# Patient Record
Sex: Female | Born: 1990 | Race: Black or African American | Hispanic: No | Marital: Single | Smoking: Never smoker
Health system: Southern US, Community
[De-identification: ages and names within clinical notes are randomized; demographics above are authoritative.]

## PROBLEM LIST (undated history)

## (undated) ENCOUNTER — Inpatient Hospital Stay: Payer: Self-pay

---

## 2009-10-14 ENCOUNTER — Ambulatory Visit: Payer: Self-pay | Admitting: Internal Medicine

## 2010-08-04 ENCOUNTER — Ambulatory Visit: Payer: Self-pay | Admitting: Internal Medicine

## 2011-03-15 ENCOUNTER — Observation Stay: Payer: Self-pay | Admitting: Obstetrics and Gynecology

## 2011-03-24 ENCOUNTER — Observation Stay: Payer: Self-pay | Admitting: Obstetrics and Gynecology

## 2011-03-25 ENCOUNTER — Ambulatory Visit: Payer: Self-pay | Admitting: Obstetrics and Gynecology

## 2011-03-29 ENCOUNTER — Observation Stay: Payer: Self-pay | Admitting: Obstetrics and Gynecology

## 2011-04-16 ENCOUNTER — Inpatient Hospital Stay: Payer: Self-pay

## 2013-09-15 ENCOUNTER — Ambulatory Visit: Payer: Self-pay | Admitting: Physician Assistant

## 2014-03-22 ENCOUNTER — Ambulatory Visit: Payer: Self-pay | Admitting: Physician Assistant

## 2014-03-22 LAB — URINALYSIS, COMPLETE
BACTERIA: NEGATIVE
BLOOD: NEGATIVE
Bilirubin,UR: NEGATIVE
GLUCOSE, UR: NEGATIVE
Ketone: NEGATIVE
Leukocyte Esterase: NEGATIVE
NITRITE: NEGATIVE
Ph: 6 (ref 5.0–8.0)
Protein: NEGATIVE
SPECIFIC GRAVITY: 1.025 (ref 1.000–1.030)

## 2014-03-22 LAB — GC/CHLAMYDIA PROBE AMP

## 2014-03-22 LAB — WET PREP, GENITAL

## 2014-05-07 LAB — OB RESULTS CONSOLE RUBELLA ANTIBODY, IGM: Rubella: IMMUNE

## 2014-05-07 LAB — OB RESULTS CONSOLE GC/CHLAMYDIA
Chlamydia: NEGATIVE
GC PROBE AMP, GENITAL: NEGATIVE

## 2014-05-07 LAB — OB RESULTS CONSOLE HEPATITIS B SURFACE ANTIGEN: HEP B S AG: NEGATIVE

## 2014-05-07 LAB — OB RESULTS CONSOLE VARICELLA ZOSTER ANTIBODY, IGG: VARICELLA IGG: IMMUNE

## 2014-05-07 LAB — OB RESULTS CONSOLE HIV ANTIBODY (ROUTINE TESTING): HIV: NONREACTIVE

## 2014-05-07 LAB — OB RESULTS CONSOLE ANTIBODY SCREEN: Antibody Screen: NEGATIVE

## 2014-05-07 LAB — OB RESULTS CONSOLE RPR: RPR: NONREACTIVE

## 2014-05-08 LAB — OB RESULTS CONSOLE ABO/RH: RH TYPE: POSITIVE

## 2014-05-14 ENCOUNTER — Encounter: Payer: Self-pay | Admitting: Family Medicine

## 2014-05-19 ENCOUNTER — Encounter: Payer: Self-pay | Admitting: Family Medicine

## 2014-06-23 ENCOUNTER — Ambulatory Visit: Payer: Self-pay | Admitting: Internal Medicine

## 2014-06-23 LAB — URINALYSIS, COMPLETE
BILIRUBIN, UR: NEGATIVE
Blood: NEGATIVE
Glucose,UR: NEGATIVE
Ketone: NEGATIVE
LEUKOCYTE ESTERASE: NEGATIVE
NITRITE: NEGATIVE
PH: 7.5 (ref 5.0–8.0)
PROTEIN: NEGATIVE
Specific Gravity: 1.02 (ref 1.000–1.030)

## 2014-06-26 ENCOUNTER — Emergency Department: Payer: Self-pay | Admitting: Emergency Medicine

## 2014-07-19 NOTE — L&D Delivery Note (Signed)
CTSP as pt is C/C/+2 station and pushing without control. Arrived with the RN delivering the head, CAN x 1 reduced per RN and body and to mom's abd at  1416  as I gowned and gloved and received the baby going to the abd. Baby cried x 1 then seemed stunned. CCx2 and cut and to warmer. Dr Cleatis PolkaAuten called but, after 2 mins the baby resumed crying. SDOP intact, FF and lochia mod. EBL 50 ml's. VSS.Hemostasis achieved. No sutures, needles or sponges used. Baby with 7-8 Apgars , 2820 6#3. FHR down in the 60's and RN had pt push to deliver to avoid fetal stress.

## 2014-09-25 LAB — OB RESULTS CONSOLE HIV ANTIBODY (ROUTINE TESTING): HIV: NONREACTIVE

## 2014-10-04 ENCOUNTER — Observation Stay: Payer: Self-pay | Admitting: Obstetrics and Gynecology

## 2014-10-13 ENCOUNTER — Ambulatory Visit: Payer: Self-pay | Admitting: Physician Assistant

## 2014-10-13 LAB — RAPID STREP-A WITH REFLX: MICRO TEXT REPORT: NEGATIVE

## 2014-10-16 LAB — BETA STREP CULTURE(ARMC)

## 2014-10-24 ENCOUNTER — Observation Stay
Admit: 2014-10-24 | Disposition: A | Discharge status: Home / Self Care | Payer: Self-pay | Attending: Obstetrics and Gynecology | Admitting: Obstetrics and Gynecology

## 2014-10-24 LAB — URINALYSIS, COMPLETE
BACTERIA: NONE SEEN
BILIRUBIN, UR: NEGATIVE
BLOOD: NEGATIVE
GLUCOSE, UR: NEGATIVE mg/dL (ref 0–75)
Ketone: NEGATIVE
Leukocyte Esterase: NEGATIVE
NITRITE: NEGATIVE
PH: 6 (ref 4.5–8.0)
Protein: NEGATIVE
RBC,UR: 1 /HPF (ref 0–5)
Specific Gravity: 1.023 (ref 1.003–1.030)
Squamous Epithelial: 1
WBC UR: 4 /HPF (ref 0–5)

## 2014-11-02 ENCOUNTER — Observation Stay
Admit: 2014-11-02 | Disposition: A | Discharge status: Home / Self Care | Payer: Self-pay | Attending: Obstetrics and Gynecology | Admitting: Obstetrics and Gynecology

## 2014-11-02 LAB — URINALYSIS, COMPLETE
Bilirubin,UR: NEGATIVE
Blood: NEGATIVE
Glucose,UR: 150 mg/dL (ref 0–75)
LEUKOCYTE ESTERASE: NEGATIVE
NITRITE: NEGATIVE
PROTEIN: NEGATIVE
Ph: 6 (ref 4.5–8.0)
Specific Gravity: 1.03 (ref 1.003–1.030)

## 2014-11-18 ENCOUNTER — Observation Stay
Admission: EM | Admit: 2014-11-18 | Discharge: 2014-11-18 | Disposition: A | Discharge status: Home / Self Care | Payer: Medicaid Other | Attending: Obstetrics and Gynecology | Admitting: Obstetrics and Gynecology

## 2014-11-18 DIAGNOSIS — Z3A39 39 weeks gestation of pregnancy: Secondary | ICD-10-CM | POA: Insufficient documentation

## 2014-11-18 MED ORDER — CYCLOBENZAPRINE HCL 10 MG PO TABS
10.0000 mg | ORAL_TABLET | Freq: Once | ORAL | Status: AC
  Administered 2014-11-18: 10 mg via ORAL
  Filled 2014-11-18 (×2): qty 1

## 2014-11-18 MED ORDER — OXYCODONE-ACETAMINOPHEN 5-325 MG PO TABS: 2.0000 | ORAL_TABLET | Freq: Once | ORAL | Status: DC

## 2014-11-18 MED ORDER — OXYCODONE-ACETAMINOPHEN 5-325 MG PO TABS
ORAL_TABLET | ORAL | Status: AC
  Filled 2014-11-18: qty 2

## 2014-11-18 NOTE — OB Triage Note (Signed)
G2P1 pt. at 6636 weeks' gestation [facility] reporting ongoing but worsening back pain she rates at 9/10 and contractions she has experienced throughout pregnancy.

## 2014-11-23 LAB — OB RESULTS CONSOLE GC/CHLAMYDIA
CHLAMYDIA, DNA PROBE: NEGATIVE
GC PROBE AMP, GENITAL: NEGATIVE

## 2014-11-23 LAB — OB RESULTS CONSOLE GBS: STREP GROUP B AG: NEGATIVE

## 2014-11-26 NOTE — H&P (Signed)
L&D Evaluation:  History:  HPI 24 yo G2P1001 at 32+3 by LMP c/w early US. She presents with 2 days of lower pelvic pressure without definitive pain or contractions. She [facility] less than 24 hours ago and was checked. Her cervix was reportedly short, but closed then. She denies vaginal bleeding, leaking fluid, discharge.   Presents with pelvic pressure   Patient's Medical History No Chronic Illness   Patient's Surgical History none   Medications Pre Natal Vitamins   Allergies NKDA   Social History none   ROS:  ROS All systems were reviewed.  HEENT, CNS, GI, GU, Respiratory, CV, Renal and Musculoskeletal systems were found to be normal.   Exam:  Vital Signs stable   Urine Protein not completed   General no apparent distress   Chest clear   Abdomen gravid, non-tender   Estimated Fetal Weight Average for gestational age   Fetal Position vtx by Leopolds   Pelvic FT/50/-4 posterior, cervix firm. Tight 0.5 cm internal os that may be tranversed with finger if this was attempted   Mebranes Intact   FHT normal rate with no decels   Fetal Heart Rate 140   Ucx absent   Plan:  Comments 1. R/O PTL: Pt is not a candidate for FFN due to recent SVE. No SVE change after 4 hours, no uterine activity noted on tocometer.  2. FWB: Reassuring tracing. 3. Plan to d/c home with precautions given and follow in 2 weeks already scheduled.   Follow Up Appointment already scheduled   Electronic Signatures: Areta HaberGoodman, David M (MD)  (Signed 07-Apr-16 07:09)  Authored: L&D Evaluation   Last Updated: 07-Apr-16 07:09 by Areta HaberGoodman, David M (MD)

## 2014-11-27 ENCOUNTER — Observation Stay
Admission: AD | Admit: 2014-11-27 | Discharge: 2014-11-27 | Disposition: A | Discharge status: Home / Self Care | Payer: Medicaid Other | Source: Ambulatory Visit | Attending: Obstetrics and Gynecology | Admitting: Obstetrics and Gynecology

## 2014-11-27 DIAGNOSIS — N898 Other specified noninflammatory disorders of vagina: Secondary | ICD-10-CM | POA: Diagnosis not present

## 2014-11-27 DIAGNOSIS — Z3A37 37 weeks gestation of pregnancy: Secondary | ICD-10-CM | POA: Insufficient documentation

## 2014-11-27 DIAGNOSIS — M545 Low back pain: Secondary | ICD-10-CM | POA: Diagnosis not present

## 2014-11-27 DIAGNOSIS — O26893 Other specified pregnancy related conditions, third trimester: Principal | ICD-10-CM | POA: Insufficient documentation

## 2014-11-27 DIAGNOSIS — Z3490 Encounter for supervision of normal pregnancy, unspecified, unspecified trimester: Secondary | ICD-10-CM

## 2014-11-27 DIAGNOSIS — Z0371 Encounter for suspected problem with amniotic cavity and membrane ruled out: Secondary | ICD-10-CM | POA: Diagnosis present

## 2014-11-27 MED ORDER — LACTATED RINGERS IV SOLN: 500.0000 mL | INTRAVENOUS | Status: DC | PRN

## 2014-11-27 NOTE — OB Triage Provider Note (Signed)
Triage Visit for Rule out Rupture of Membranes   Subjective:   Tracey Hood is a 24 y.o. female.G2p1001. She is at 4820w2d gestation. She has noted increased vaginal discharge for the last 2 days.  She denies bleeding, contractions, cramping. No vaginal pain or pruritius. No dysparunia. Good fetal movement, but less than previously in pregnancy.  Toco noted prolonged deceleration after 5 minute contraction that the patient didn't feel. Spontaneously returned to baseline with mod var, +accels.  Her pregnancy has been complicated by: lower back pain  PMH, PSH, POBH and problem list reviewed Medications and allergies reviewed.    Objective:   Filed Vitals:   11/27/14 1240 11/27/14 1255 11/27/14 1310 11/27/14 1325  BP: 117/61 120/64 115/68 122/65  Pulse: 79 81 84 87    General appearance: alert, well appearing, and in no distress. Abdomen: soft, non tender. Sterile speculum exam: white physiologic discharge.   External fetal monitoring: 135, mod var, +accels, no decels except as noted above Ultrasound:  Cephalic presentation AFI 13.3cm Anterior placenta, spine to maternal right  Wet mount: Neg for pseudohyphae, trichomonas, clue cells. + many lactobacilli. Neg for ferning.    Assessment:   Pregnancy at 9520w2d with concerns for PROM. Spontaneous deceleration on monitoring.  Evaluation reveals: Intact membranes, prolonged fetal monitoring reassuring. AFI adequate, which is reassuring for placenta profusion and function.   Plan:   Prolonged monitoring. D/C home with fetal kick counts reviewed. 10 movements/1hr.  Orders placed:  Orders Placed This Encounter  Procedures  . Place in observation (patient's expected length of stay will be less than 2 midnights)    Standing Status: Standing     Number of Occurrences: 1     Standing Expiration Date:     Order Specific Question:  Diagnosis    Answer:  Pregnancy [203531]    Order Specific Question:  Level of Care   Answer:  BIRTHING SUITE [17]    Order Specific Question:  Admitting Physician    Answer:  Darrell JewelBEASLEY, Chinedu Agustin [[id number]]    Order Specific Question:  Attending Physician    Answer:  Darrell JewelBEASLEY, Shirlene Andaya [[id number]]    Order Specific Question:  PT Class (Do Not Modify)    Answer:  Observation [104]    Order Specific Question:  PT Acc Code (Do Not Modify)    Answer:  Observation [10022]    Patient expresses understanding of information provided and plan of care.

## 2014-11-27 NOTE — Discharge Instructions (Signed)
Fetal Movement Counts  Patient Name: __________________________________________________ Patient Due Date: ____________________ Performing a fetal movement count is highly recommended in high-risk pregnancies, but it is good for every pregnant woman to do. Your health care provider may ask you to start counting fetal movements at 28 weeks of the pregnancy. Fetal movements often increase:  After eating a full meal.  After physical activity.  After eating or drinking something sweet or cold.  At rest. Pay attention to when you feel the baby is most active. This will help you notice a pattern of your baby's sleep and wake cycles and what factors contribute to an increase in fetal movement. It is important to perform a fetal movement count at the same time each day when your baby is normally most active.  HOW TO COUNT FETAL MOVEMENTS 1. Find a quiet and comfortable area to sit or lie down on your left side. Lying on your left side provides the best blood and oxygen circulation to your baby. 2. Write down the day and time on a sheet of paper or in a journal. 3. Start counting kicks, flutters, swishes, rolls, or jabs in a 2-hour period. You should feel at least 10 movements within 2 hours. 4. If you do not feel 10 movements in 2 hours, wait 2-3 hours and count again. Look for a change in the pattern or not enough counts in 2 hours. SEEK MEDICAL CARE IF:  You feel less than 10 counts in 2 hours, tried twice.  There is no movement in over an hour.  The pattern is changing or taking longer each day to reach 10 counts in 2 hours.  You feel the baby is not moving as he or she usually does.  For example:  Date: ____________ Movements: ____________ Start time: ____________ Doreatha MartinFinish time: ____________  Date: ____________ Movements: ____________ Start time: ____________ Doreatha MartinFinish time: ____________

## 2014-12-10 ENCOUNTER — Observation Stay
Admission: EM | Admit: 2014-12-10 | Discharge: 2014-12-10 | Disposition: A | Discharge status: Home / Self Care | Payer: Medicaid Other | Attending: Obstetrics and Gynecology | Admitting: Obstetrics and Gynecology

## 2014-12-10 ENCOUNTER — Encounter: Payer: Self-pay | Admitting: *Deleted

## 2014-12-10 DIAGNOSIS — R109 Unspecified abdominal pain: Secondary | ICD-10-CM | POA: Diagnosis present

## 2014-12-10 DIAGNOSIS — O26899 Other specified pregnancy related conditions, unspecified trimester: Secondary | ICD-10-CM | POA: Diagnosis not present

## 2014-12-10 MED ORDER — ACETAMINOPHEN 325 MG PO TABS: 650.0000 mg | ORAL_TABLET | ORAL | Status: DC | PRN

## 2014-12-10 NOTE — OB Triage Note (Signed)
Patient states she started contracting around 830 this morning and they are every 5 to 6 minutes now with constant back pain.

## 2014-12-10 NOTE — Discharge Summary (Signed)
Patient discharged home, discharge instructions given, pt states understanding. Pt left floor in stable condition.denies any other needs at this time

## 2014-12-10 NOTE — Discharge Instructions (Signed)
Drink plenty of fluid, get plenty of rest. Call your provider for any other concerns.

## 2014-12-11 ENCOUNTER — Observation Stay
Admission: EM | Admit: 2014-12-11 | Discharge: 2014-12-11 | Disposition: A | Discharge status: Home / Self Care | Payer: Medicaid Other | Source: Home / Self Care | Admitting: Obstetrics and Gynecology

## 2014-12-11 DIAGNOSIS — O99891 Other specified diseases and conditions complicating pregnancy: Secondary | ICD-10-CM | POA: Diagnosis present

## 2014-12-11 DIAGNOSIS — R1030 Lower abdominal pain, unspecified: Secondary | ICD-10-CM

## 2014-12-11 DIAGNOSIS — O9989 Other specified diseases and conditions complicating pregnancy, childbirth and the puerperium: Secondary | ICD-10-CM

## 2014-12-11 DIAGNOSIS — M549 Dorsalgia, unspecified: Secondary | ICD-10-CM

## 2014-12-11 MED ORDER — ZOLPIDEM TARTRATE 5 MG PO TABS: 5.0000 mg | ORAL_TABLET | Freq: Every evening | ORAL | Status: DC | PRN

## 2014-12-11 MED ORDER — ZOLPIDEM TARTRATE 5 MG PO TABS
ORAL_TABLET | ORAL | Status: AC
  Administered 2014-12-11: 22:00:00
  Filled 2014-12-11: qty 1

## 2014-12-11 NOTE — OB Triage Note (Signed)
Patient complains of contractions and back pain, starting yesterday.  Contractions increasing in intensity about two hours ago.  Patient also complains of diarrhea today.

## 2014-12-11 NOTE — Discharge Instructions (Signed)
Braxton Hicks Contractions °Contractions of the uterus can occur throughout pregnancy. Contractions are not always a sign that you are in labor.  °WHAT ARE BRAXTON HICKS CONTRACTIONS?  °Contractions that occur before labor are called Braxton Hicks contractions, or false labor. Toward the end of pregnancy (32-34 weeks), these contractions can develop more often and may become more forceful. This is not true labor because these contractions do not result in opening (dilatation) and thinning of the cervix. They are sometimes difficult to tell apart from true labor because these contractions can be forceful and people have different pain tolerances. You should not feel embarrassed if you go to the hospital with false labor. Sometimes, the only way to tell if you are in true labor is for your health care provider to look for changes in the cervix. °If there are no prenatal problems or other health problems associated with the pregnancy, it is completely safe to be sent home with false labor and await the onset of true labor. °HOW CAN YOU TELL THE DIFFERENCE BETWEEN TRUE AND FALSE LABOR? °False Labor °· The contractions of false labor are usually shorter and not as hard as those of true labor.   °· The contractions are usually irregular.   °· The contractions are often felt in the front of the lower abdomen and in the groin.   °· The contractions may go away when you walk around or change positions while lying down.   °· The contractions get weaker and are shorter lasting as time goes on.   °· The contractions do not usually become progressively stronger, regular, and closer together as with true labor.   °True Labor °· Contractions in true labor last 30-70 seconds, become very regular, usually become more intense, and increase in frequency.   °· The contractions do not go away with walking.   °· The discomfort is usually felt in the top of the uterus and spreads to the lower abdomen and low back.   °· True labor can be  determined by your health care provider with an exam. This will show that the cervix is dilating and getting thinner.   °WHAT TO REMEMBER °· Keep up with your usual exercises and follow other instructions given by your health care provider.   °· Take medicines as directed by your health care provider.   °· Keep your regular prenatal appointments.   °· Eat and drink lightly if you think you are going into labor.   °· If Braxton Hicks contractions are making you uncomfortable:   °¨ Change your position from lying down or resting to walking, or from walking to resting.   °¨ Sit and rest in a tub of warm water.   °¨ Drink 2-3 glasses of water. Dehydration may cause these contractions.   °¨ Do slow and deep breathing several times an hour.   °WHEN SHOULD I SEEK IMMEDIATE MEDICAL CARE? °Seek immediate medical care if: °· Your contractions become stronger, more regular, and closer together.   °· You have fluid leaking or gushing from your vagina.   °· You have a fever.   °· You pass blood-tinged mucus.   °· You have vaginal bleeding.   °· You have continuous abdominal pain.   °· You have low back pain that you never had before.   °· You feel your baby's head pushing down and causing pelvic pressure.   °· Your baby is not moving as much as it used to.   °Document Released: 07/05/2005 Document Revised: 07/10/2013 Document Reviewed: 04/16/2013 °[employer]® Patient Information ©2015 [employer]. This information is not intended to replace advice given to you by your health care   provider. Make sure you discuss any questions you have with your health care provider. ° °

## 2014-12-12 ENCOUNTER — Inpatient Hospital Stay: Payer: Medicaid Other | Admitting: Registered Nurse

## 2014-12-12 ENCOUNTER — Inpatient Hospital Stay
Admission: EM | Admit: 2014-12-12 | Discharge: 2014-12-14 | DRG: 775 | Disposition: A | Discharge status: Home / Self Care | Payer: Medicaid Other | Attending: Obstetrics and Gynecology | Admitting: Obstetrics and Gynecology

## 2014-12-12 ENCOUNTER — Encounter: Payer: Self-pay | Admitting: *Deleted

## 2014-12-12 DIAGNOSIS — M542 Cervicalgia: Secondary | ICD-10-CM | POA: Diagnosis present

## 2014-12-12 DIAGNOSIS — R1030 Lower abdominal pain, unspecified: Secondary | ICD-10-CM

## 2014-12-12 DIAGNOSIS — Z3A39 39 weeks gestation of pregnancy: Secondary | ICD-10-CM | POA: Diagnosis present

## 2014-12-12 DIAGNOSIS — M545 Low back pain: Secondary | ICD-10-CM | POA: Diagnosis present

## 2014-12-12 LAB — TYPE AND SCREEN
ABO/RH(D): B POS
Antibody Screen: NEGATIVE

## 2014-12-12 LAB — CBC
HEMATOCRIT: 41.5 % (ref 35.0–47.0)
Hemoglobin: 14.1 g/dL (ref 12.0–16.0)
MCH: 32.6 pg (ref 26.0–34.0)
MCHC: 33.8 g/dL (ref 32.0–36.0)
MCV: 96.4 fL (ref 80.0–100.0)
Platelets: 149 10*3/uL — ABNORMAL LOW (ref 150–440)
RBC: 4.31 MIL/uL (ref 3.80–5.20)
RDW: 14.7 % — ABNORMAL HIGH (ref 11.5–14.5)
WBC: 10.5 10*3/uL (ref 3.6–11.0)

## 2014-12-12 LAB — ABO/RH: ABO/RH(D): B POS

## 2014-12-12 MED ORDER — BUPIVACAINE HCL (PF) 0.25 % IJ SOLN
INTRAMUSCULAR | Status: DC | PRN
  Administered 2014-12-12: 5 mL

## 2014-12-12 MED ORDER — LACTATED RINGERS IV SOLN
INTRAVENOUS | Status: DC
  Administered 2014-12-12: 13:00:00 via INTRAVENOUS

## 2014-12-12 MED ORDER — FENTANYL 2.5 MCG/ML W/ROPIVACAINE 0.2% IN NS 100 ML EPIDURAL INFUSION (ARMC-ANES)
9.0000 mL/h | EPIDURAL | Status: DC
  Administered 2014-12-12: 9 mL/h via EPIDURAL

## 2014-12-12 MED ORDER — OXYTOCIN 10 UNIT/ML IJ SOLN
INTRAMUSCULAR | Status: AC
Start: 2014-12-12 — End: 2014-12-12
  Filled 2014-12-12: qty 2

## 2014-12-12 MED ORDER — PHENYLEPHRINE 40 MCG/ML (10ML) SYRINGE FOR IV PUSH (FOR BLOOD PRESSURE SUPPORT)
80.0000 ug | PREFILLED_SYRINGE | INTRAVENOUS | Status: DC | PRN
  Filled 2014-12-12: qty 2

## 2014-12-12 MED ORDER — TERBUTALINE SULFATE 1 MG/ML IJ SOLN
0.2500 mg | Freq: Once | INTRAMUSCULAR | Status: AC | PRN
  Filled 2014-12-12: qty 1

## 2014-12-12 MED ORDER — LACTATED RINGERS IV SOLN: 500.0000 mL | INTRAVENOUS | Status: DC | PRN

## 2014-12-12 MED ORDER — DIPHENHYDRAMINE HCL 50 MG/ML IJ SOLN: 12.5000 mg | INTRAMUSCULAR | Status: DC | PRN

## 2014-12-12 MED ORDER — PENICILLIN G POTASSIUM 5000000 UNITS IJ SOLR
2.5000 10*6.[IU] | INTRAVENOUS | Status: DC
  Filled 2014-12-12 (×7): qty 2.5

## 2014-12-12 MED ORDER — CITRIC ACID-SODIUM CITRATE 334-500 MG/5ML PO SOLN: 30.0000 mL | ORAL | Status: DC | PRN

## 2014-12-12 MED ORDER — OXYCODONE-ACETAMINOPHEN 5-325 MG PO TABS
1.0000 | ORAL_TABLET | ORAL | Status: DC | PRN
Start: 2014-12-12 — End: 2014-12-13

## 2014-12-12 MED ORDER — OXYTOCIN 40 UNITS IN LACTATED RINGERS INFUSION - SIMPLE MED
62.5000 mL/h | INTRAVENOUS | Status: DC
  Administered 2014-12-12: 62.5 mL/h via INTRAVENOUS

## 2014-12-12 MED ORDER — LIDOCAINE HCL (PF) 1 % IJ SOLN
30.0000 mL | INTRAMUSCULAR | Status: DC | PRN
  Filled 2014-12-12: qty 30

## 2014-12-12 MED ORDER — MISOPROSTOL 200 MCG PO TABS
ORAL_TABLET | ORAL | Status: AC
  Filled 2014-12-12: qty 4

## 2014-12-12 MED ORDER — OXYTOCIN 40 UNITS IN LACTATED RINGERS INFUSION - SIMPLE MED: 1.0000 m[IU]/min | INTRAVENOUS | Status: DC

## 2014-12-12 MED ORDER — EPHEDRINE 5 MG/ML INJ
10.0000 mg | INTRAVENOUS | Status: DC | PRN
  Filled 2014-12-12: qty 2

## 2014-12-12 MED ORDER — PENICILLIN G POTASSIUM 5000000 UNITS IJ SOLR
5.0000 10*6.[IU] | Freq: Once | INTRAVENOUS | Status: DC
  Filled 2014-12-12: qty 5

## 2014-12-12 MED ORDER — ACETAMINOPHEN 325 MG PO TABS
650.0000 mg | ORAL_TABLET | ORAL | Status: DC | PRN
  Administered 2014-12-13 (×2): 650 mg via ORAL
  Filled 2014-12-12 (×2): qty 2

## 2014-12-12 MED ORDER — OXYTOCIN BOLUS FROM INFUSION
500.0000 mL | INTRAVENOUS | Status: DC
  Administered 2014-12-12: 500 mL via INTRAVENOUS

## 2014-12-12 MED ORDER — BUTORPHANOL TARTRATE 1 MG/ML IJ SOLN
INTRAMUSCULAR | Status: AC
  Administered 2014-12-12: 2 mg via INTRAVENOUS
  Filled 2014-12-12: qty 2

## 2014-12-12 MED ORDER — FENTANYL 2.5 MCG/ML W/ROPIVACAINE 0.2% IN NS 100 ML EPIDURAL INFUSION (ARMC-ANES)
EPIDURAL | Status: AC
  Administered 2014-12-12 (×2): 9 mL/h via EPIDURAL
  Filled 2014-12-12: qty 100

## 2014-12-12 MED ORDER — OXYTOCIN 40 UNITS IN LACTATED RINGERS INFUSION - SIMPLE MED
INTRAVENOUS | Status: AC
  Filled 2014-12-12: qty 1000

## 2014-12-12 MED ORDER — LIDOCAINE-EPINEPHRINE (PF) 1.5 %-1:200000 IJ SOLN
INTRAMUSCULAR | Status: DC | PRN
  Administered 2014-12-12: 3.5 mL

## 2014-12-12 MED ORDER — OXYCODONE-ACETAMINOPHEN 5-325 MG PO TABS
2.0000 | ORAL_TABLET | ORAL | Status: DC | PRN
Start: 2014-12-12 — End: 2014-12-13

## 2014-12-12 MED ORDER — BUTORPHANOL TARTRATE 1 MG/ML IJ SOLN: 1.0000 mg | INTRAMUSCULAR | Status: DC | PRN

## 2014-12-12 MED ORDER — LIDOCAINE HCL (PF) 1 % IJ SOLN
INTRAMUSCULAR | Status: AC
  Administered 2014-12-12: 3 mL
  Administered 2014-12-12: 3 mL via SUBCUTANEOUS
  Filled 2014-12-12: qty 30

## 2014-12-12 MED ORDER — ONDANSETRON HCL 4 MG/2ML IJ SOLN: 4.0000 mg | Freq: Four times a day (QID) | INTRAMUSCULAR | Status: DC | PRN

## 2014-12-12 MED ORDER — BUTORPHANOL TARTRATE 1 MG/ML IJ SOLN
1.0000 mg | INTRAMUSCULAR | Status: AC
  Administered 2014-12-12: 2 mg via INTRAVENOUS

## 2014-12-12 MED ORDER — AMMONIA AROMATIC IN INHA
RESPIRATORY_TRACT | Status: AC
  Filled 2014-12-12: qty 10

## 2014-12-12 NOTE — Progress Notes (Signed)
Transferred via wheelchair to 342 with infant in crib.  Belongings with patient

## 2014-12-12 NOTE — Progress Notes (Signed)
Tracey Hood is a 24 y.o. G2P1001 at 973w3d by admitted for active labor. Epidural in place.   Subjective: No complaints  Objective: BP 125/67 mmHg  Pulse 74  Temp(Src) 98.4 F (36.9 C) (Oral)  Resp 18  Ht 5\' 3"  (1.6 m)  Wt 73.483 kg (162 lb)  BMI 28.70 kg/m2  LMP 03/11/2014 (Exact Date)      FHT:  FHR: 150, +accels, no decels, Cat I bpm, variability: moderate,  accelerations:  Present,  decelerations:  Absent UC:   regular, every 9 minutes SVE:   Dilation: 6 Effacement (%): 90 Station: 0 Exam by:: HFC  Labs: Lab Results  Component Value Date   WBC 10.5 12/12/2014   HGB 14.1 12/12/2014   HCT 41.5 12/12/2014   MCV 96.4 12/12/2014   PLT 149* 12/12/2014    Assessment / Plan: Spontaneous labor, progressing normally  Labor: Progressing normally and UC's are q 9 mins.  Preeclampsia:  none Fetal Wellbeing:  Category I Pain Control:  Epidural I/D:  n/a Anticipated MOD:  NSVD  Will do iUPC and Pitocin  Sharee PimpleJONES, [place] Hallam W 12/12/2014, 12:49 PM

## 2014-12-12 NOTE — H&P (Signed)
Melida Quitterecohvia Rena Williams is a 24 y.o. female presenting for labor with cervical change of 6-8/80/vtx. PNC at [facility] significant for GBS neg. Low back pain with PT 2 x a week x 6 weeks. LMP of 03/11/14 & EDD of 12/16/14. C/W US at 3618 with EDD of 12/11/14 History OB History    Gravida Para Term Preterm AB TAB SAB Ectopic Multiple Living   2 1 1  0 0 0 0 0 0 1     PMH: Low back pain  No past surgical history on file. Family History: family history is negative for Alcohol abuse, Arthritis, Birth defects, Cancer, Drug abuse, Asthma, Diabetes, COPD, Depression, Early death, Hearing loss, Heart disease, Hyperlipidemia, Hypertension, Kidney disease, Learning disabilities, Mental illness, Mental retardation, Miscarriages / Stillbirths, Stroke, Vision loss, and Varicose Veins. Social History:  reports that she has never smoked. She has never used smokeless tobacco. She reports that she does not drink alcohol or use illicit drugs.   Prenatal Transfer Tool  Maternal Diabetes: No Genetic Screening: Declined Maternal Ultrasounds/Referrals: Normal Fetal Ultrasounds or other Referrals:  None Maternal Substance Abuse:  No Significant Maternal Medications:  None Significant Maternal Lab Results:  Lab values include: Other:  Other Comments:  Tdap declined, took Flu shot, RI, B pos, antibody neg, RPR NR, GC/CH neg, HIV NR, HIBV NR, GBS neg  ROS  Dilation: 6 Effacement (%): 90 Station: -1 Exam by:: k.yates, rn Last menstrual period 03/11/2014. Exam Physical Exam  Prenatal labs: ABO, Rh: B/Positive/-- (10/21 0000) Antibody: Negative (10/20 0000) Rubella: Immune (10/20 0000) RPR: Nonreactive (10/20 0000)  HBsAg: Negative (10/20 0000)  HIV: Non-reactive (03/09 0000)  GBS: Negative (05/07 0000)   Assessment/Plan: A: IUP at 39 3/7 weeks 2. GBS neg P: 1. Epidural for pain 2 Stadol 1-2 mg IV till Epidural in. 3. Admit for NSVD.   Milon ScoreJONES, CARON W 12/12/2014, 11:01 AM

## 2014-12-12 NOTE — Progress Notes (Signed)
Patient ID: Tracey Hood, female   DOB: 02/03/1991, 24 y.o.   MRN: [id number]  1300 AROM for very scant clear drops noted. IFSE and IUPC inserted. Pitocin augmentation added to orders. Early decel noted.

## 2014-12-12 NOTE — Anesthesia Procedure Notes (Signed)
Epidural Patient location during procedure: OB Start time: 12/12/2014 12:10 PM  Staffing Anesthesiologist: Yevette EdwardsADAMS, JAMES G Resident/CRNA: Junious SilkNOLES, Marlowe Cinquemani Performed by: anesthesiologist and resident/CRNA   Preanesthetic Checklist Completed: patient identified, site marked, surgical consent, pre-op evaluation, timeout performed, IV checked and monitors and equipment checked  Epidural Patient position: sitting Prep: Betadine Patient monitoring: heart rate, cardiac monitor, continuous pulse ox and blood pressure Approach: midline Location: L3-L4 Injection technique: LOR air  Needle:  Needle type: Tuohy  Needle gauge: 18 G Needle length: 9 cm Needle insertion depth: 5 cm Catheter type: closed end Catheter size: 20 Guage Test dose: negative and 1.5% lidocaine with Epi 1:200 K  Assessment Sensory level: T10  Additional Notes First attempt wet tap by Harrel LemonM Joelle Roswell CRNA.  Called J. Adams MDA for second attempt successful.Reason for block:procedure for pain

## 2014-12-13 LAB — RPR: RPR Ser Ql: NONREACTIVE

## 2014-12-13 LAB — CBC
HCT: 40.1 % (ref 35.0–47.0)
Hemoglobin: 13.5 g/dL (ref 12.0–16.0)
MCH: 32.2 pg (ref 26.0–34.0)
MCHC: 33.6 g/dL (ref 32.0–36.0)
MCV: 95.8 fL (ref 80.0–100.0)
Platelets: 149 10*3/uL — ABNORMAL LOW (ref 150–440)
RBC: 4.19 MIL/uL (ref 3.80–5.20)
RDW: 14.6 % — ABNORMAL HIGH (ref 11.5–14.5)
WBC: 15.9 10*3/uL — ABNORMAL HIGH (ref 3.6–11.0)

## 2014-12-13 MED ORDER — FLEET ENEMA 7-19 GM/118ML RE ENEM: 1.0000 | ENEMA | Freq: Every day | RECTAL | Status: DC | PRN

## 2014-12-13 MED ORDER — SODIUM CHLORIDE 0.9 % IV SOLN: 250.0000 mL | INTRAVENOUS | Status: DC | PRN

## 2014-12-13 MED ORDER — ZOLPIDEM TARTRATE 5 MG PO TABS: 5.0000 mg | ORAL_TABLET | Freq: Every evening | ORAL | Status: DC | PRN

## 2014-12-13 MED ORDER — PRENATAL MULTIVITAMIN CH: 1.0000 | ORAL_TABLET | Freq: Every day | ORAL | Status: DC

## 2014-12-13 MED ORDER — ACETAMINOPHEN 325 MG PO TABS: 650.0000 mg | ORAL_TABLET | ORAL | Status: DC | PRN

## 2014-12-13 MED ORDER — SIMETHICONE 80 MG PO CHEW: 80.0000 mg | CHEWABLE_TABLET | ORAL | Status: DC | PRN

## 2014-12-13 MED ORDER — WITCH HAZEL-GLYCERIN EX PADS: 1.0000 "application " | MEDICATED_PAD | CUTANEOUS | Status: DC | PRN

## 2014-12-13 MED ORDER — IBUPROFEN 600 MG PO TABS
600.0000 mg | ORAL_TABLET | Freq: Four times a day (QID) | ORAL | Status: DC
  Administered 2014-12-13 – 2014-12-14 (×2): 600 mg via ORAL
  Filled 2014-12-13: qty 1

## 2014-12-13 MED ORDER — DIBUCAINE 1 % RE OINT: 1.0000 "application " | TOPICAL_OINTMENT | RECTAL | Status: DC | PRN

## 2014-12-13 MED ORDER — OXYCODONE-ACETAMINOPHEN 5-325 MG PO TABS: 2.0000 | ORAL_TABLET | ORAL | Status: DC | PRN

## 2014-12-13 MED ORDER — ONDANSETRON HCL 4 MG PO TABS: 4.0000 mg | ORAL_TABLET | ORAL | Status: DC | PRN

## 2014-12-13 MED ORDER — DIPHENHYDRAMINE HCL 25 MG PO CAPS: 25.0000 mg | ORAL_CAPSULE | Freq: Four times a day (QID) | ORAL | Status: DC | PRN

## 2014-12-13 MED ORDER — TETANUS-DIPHTH-ACELL PERTUSSIS 5-2.5-18.5 LF-MCG/0.5 IM SUSP
0.5000 mL | Freq: Once | INTRAMUSCULAR | Status: DC
  Filled 2014-12-13: qty 0.5

## 2014-12-13 MED ORDER — OXYTOCIN 40 UNITS IN LACTATED RINGERS INFUSION - SIMPLE MED: 62.5000 mL/h | INTRAVENOUS | Status: DC | PRN

## 2014-12-13 MED ORDER — SENNOSIDES-DOCUSATE SODIUM 8.6-50 MG PO TABS
2.0000 | ORAL_TABLET | ORAL | Status: DC
  Administered 2014-12-14: 2 via ORAL
  Filled 2014-12-13: qty 2

## 2014-12-13 MED ORDER — BENZOCAINE-MENTHOL 20-0.5 % EX AERO
1.0000 | INHALATION_SPRAY | CUTANEOUS | Status: DC | PRN
Start: 2014-12-13 — End: 2014-12-15

## 2014-12-13 MED ORDER — LANOLIN HYDROUS EX OINT: TOPICAL_OINTMENT | CUTANEOUS | Status: DC | PRN

## 2014-12-13 MED ORDER — OXYCODONE-ACETAMINOPHEN 5-325 MG PO TABS
1.0000 | ORAL_TABLET | ORAL | Status: DC | PRN
Start: 2014-12-13 — End: 2014-12-15
  Administered 2014-12-14: 1 via ORAL
  Filled 2014-12-13: qty 1

## 2014-12-13 MED ORDER — BISACODYL 10 MG RE SUPP: 10.0000 mg | Freq: Every day | RECTAL | Status: DC | PRN

## 2014-12-13 MED ORDER — ONDANSETRON HCL 4 MG/2ML IJ SOLN: 4.0000 mg | INTRAMUSCULAR | Status: DC | PRN

## 2014-12-13 MED ORDER — MEASLES, MUMPS & RUBELLA VAC ~~LOC~~ INJ
0.5000 mL | INJECTION | Freq: Once | SUBCUTANEOUS | Status: DC
  Filled 2014-12-13 (×2): qty 0.5

## 2014-12-13 MED ORDER — SODIUM CHLORIDE 0.9 % IJ SOLN: 3.0000 mL | Freq: Two times a day (BID) | INTRAMUSCULAR | Status: DC

## 2014-12-13 MED ORDER — IBUPROFEN 600 MG PO TABS
600.0000 mg | ORAL_TABLET | Freq: Four times a day (QID) | ORAL | Status: DC | PRN
  Administered 2014-12-13 – 2014-12-14 (×3): 600 mg via ORAL
  Filled 2014-12-13 (×4): qty 1

## 2014-12-13 MED ORDER — SODIUM CHLORIDE 0.9 % IJ SOLN: 3.0000 mL | INTRAMUSCULAR | Status: DC | PRN

## 2014-12-13 NOTE — Anesthesia Preprocedure Evaluation (Signed)
Anesthesia Evaluation  Patient identified by MRN, date of birth, ID band Patient awake    Reviewed: Allergy & Precautions, H&P , NPO status , Patient's Chart, lab work & pertinent test results, reviewed documented beta blocker date and time   Airway Mallampati: II  TM Distance: >3 FB Neck ROM: full    Dental no notable dental hx.    Pulmonary neg pulmonary ROS,  breath sounds clear to auscultation  Pulmonary exam normal       Cardiovascular Exercise Tolerance: Good negative cardio ROS  Rhythm:regular Rate:Normal     Neuro/Psych negative neurological ROS  negative psych ROS   GI/Hepatic negative GI ROS, Neg liver ROS,   Endo/Other  negative endocrine ROS  Renal/GU negative Renal ROS  negative genitourinary   Musculoskeletal   Abdominal   Peds  Hematology negative hematology ROS (+)   Anesthesia Other Findings   Reproductive/Obstetrics (+) Pregnancy                             Anesthesia Physical Anesthesia Plan  ASA: II  Anesthesia Plan: Regional and Epidural   Post-op Pain Management:    Induction:   Airway Management Planned:   Additional Equipment:   Intra-op Plan:   Post-operative Plan:   Informed Consent: I have reviewed the patients History and Physical, chart, labs and discussed the procedure including the risks, benefits and alternatives for the proposed anesthesia with the patient or authorized representative who has indicated his/her understanding and acceptance.     Plan Discussed with: CRNA  Anesthesia Plan Comments:         Anesthesia Quick Evaluation

## 2014-12-13 NOTE — Anesthesia Postprocedure Evaluation (Incomplete)
  Anesthesia Post-op Note  Patient: Tracey Hood  Procedure(s) Performed: * No procedures listed *  Anesthesia type:No value filed.  Patient location: PACU  Post pain: Pain level controlled  Post assessment: Post-op Vital signs reviewed, Patient's Cardiovascular Status Stable, Respiratory Function Stable, Patent Airway and No signs of Nausea or vomiting  Post vital signs: Reviewed and stable  Last Vitals:  Filed Vitals:   12/13/14 0758  BP: 115/61  Pulse: 77  Temp: 36.7 C  Resp: 20    Level of consciousness: awake, alert  and patient cooperative  Complications: No apparent anesthesia complications

## 2014-12-13 NOTE — Progress Notes (Signed)
VSS. Fundus firm, rubra mod-light. Up to the bathroom. Voided qs without difficulty. PRN given X1 for headache. Saline lock in place.

## 2014-12-13 NOTE — Progress Notes (Signed)
Post Partum Day 1 Subjective: mild h/a and neck pain   Objective: Blood pressure 115/61, pulse 77, temperature 98.1 F (36.7 C), temperature source Oral, resp. rate 20, height 5\' 3"  (1.6 m), weight 73.483 kg (162 lb), last menstrual period 03/11/2014, SpO2 99 %, unknown if currently breastfeeding.  Physical Exam:  General: alert and cooperative Lochia: appropriate Uterine Fundus: firm Incision DVT Evaluation: No evidence of DVT seen on physical exam.   Recent Labs  12/12/14 1016  HGB 14.1  HCT 41.5  labs pending from today  Assessment/Plan:  possible spinal h/a  Plan for discharge tomorrow  Add motrin 600 mg q 6 hrs , po hydrate    LOS: 1 day   Tracey Hood 12/13/2014, 10:09 AM

## 2014-12-13 NOTE — Anesthesia Postprocedure Evaluation (Signed)
  Anesthesia Post-op Note  Patient: Tracey Hood  Procedure(s) Performed: * No procedures listed *  Anesthesia type:epidural  Patient location: 342-A  Post pain: Pain level controlled  Post assessment: Post-op Vital signs reviewed, Patient's Cardiovascular Status Stable, Respiratory Function Stable, Patent Airway and No signs of Nausea or vomiting  Post vital signs: Reviewed and stable  Last Vitals:  Filed Vitals:   12/13/14 0424  BP: 120/72  Pulse: 74  Temp: 37.1 C  Resp: 20    Level of consciousness: awake, alert  and patient cooperative  Complications: No apparent anesthesia complications

## 2014-12-13 NOTE — Addendum Note (Signed)
Addendum  created 12/13/14 1147 by Junious SilkMark Kailynne Ferrington, CRNA   Modules edited: Notes Section   Notes Section:  Pend: [id number]

## 2014-12-14 LAB — HIV ANTIBODY (ROUTINE TESTING W REFLEX): HIV Screen 4th Generation wRfx: NONREACTIVE

## 2014-12-14 MED ORDER — IBUPROFEN 600 MG PO TABS: 600.0000 mg | ORAL_TABLET | Freq: Four times a day (QID) | ORAL | Status: DC

## 2014-12-14 MED ORDER — OXYCODONE-ACETAMINOPHEN 5-325 MG PO TABS: 1.0000 | ORAL_TABLET | ORAL | Status: DC | PRN

## 2014-12-14 MED ORDER — MEDROXYPROGESTERONE ACETATE 150 MG/ML IM SUSP: 150.0000 mg | Freq: Once | INTRAMUSCULAR | Status: DC

## 2014-12-14 NOTE — Discharge Summary (Signed)
Obstetric Discharge Summary Reason for Admission: onset of labor Prenatal Procedures: none Intrapartum Procedures: spontaneous vaginal delivery Postpartum Procedures: none Complications-Operative and Postpartum: none HEMOGLOBIN  Date Value Ref Range Status  12/13/2014 13.5 12.0 - 16.0 g/dL Final   HCT  Date Value Ref Range Status  12/13/2014 40.1 35.0 - 47.0 % Final    Physical Exam:  General: alert and cooperative, mild h/a and neck pain  Lochia: appropriate Uterine Fundus: firm Incision:none DVT Evaluation: No evidence of DVT seen on physical exam.  Discharge Diagnoses: Term Pregnancy-delivered  Discharge Information: Date: 12/14/2014 Activity: unrestricted Diet: routine Medications: Ibuprofen and Percocet Condition: stable Instructions: refer to practice specific booklet Discharge to: home Follow-up Information    [facility].   Why:  postpartum care, caron jones   Contact information:   [address] [place] [phone]       Newborn Data: Live born female  Birth Weight: 6 lb 3.5 oz (2820 g) APGAR: 7, 8  Home with mother.  Tracey Hood 12/14/2014, 12:25 PM

## 2014-12-14 NOTE — Progress Notes (Signed)
Discharge teaching and care planning completed  Discharged via w/c with family

## 2014-12-17 ENCOUNTER — Ambulatory Visit
Admission: RE | Admit: 2014-12-17 | Discharge: 2014-12-17 | Disposition: A | Discharge status: Home / Self Care | Payer: Medicaid Other | Source: Ambulatory Visit | Attending: Anesthesiology | Admitting: Anesthesiology

## 2014-12-17 ENCOUNTER — Ambulatory Visit: Payer: Medicaid Other

## 2014-12-17 ENCOUNTER — Emergency Department
Admission: EM | Admit: 2014-12-17 | Discharge: 2014-12-17 | Disposition: A | Discharge status: Still In Hospital | Payer: Medicaid Other

## 2014-12-17 ENCOUNTER — Ambulatory Visit: Admission: RE | Admit: 2014-12-17 | Payer: Medicaid Other | Admitting: Anesthesiology

## 2014-12-17 ENCOUNTER — Encounter
Admission: RE | Disposition: A | Discharge status: Home / Self Care | Payer: Self-pay | Source: Ambulatory Visit | Attending: Anesthesiology

## 2014-12-17 DIAGNOSIS — Y838 Other surgical procedures as the cause of abnormal reaction of the patient, or of later complication, without mention of misadventure at the time of the procedure: Secondary | ICD-10-CM | POA: Diagnosis not present

## 2014-12-17 DIAGNOSIS — T8859XA Other complications of anesthesia, initial encounter: Secondary | ICD-10-CM | POA: Insufficient documentation

## 2014-12-17 DIAGNOSIS — G4440 Drug-induced headache, not elsewhere classified, not intractable: Secondary | ICD-10-CM | POA: Diagnosis not present

## 2014-12-17 DIAGNOSIS — G4489 Other headache syndrome: Secondary | ICD-10-CM | POA: Diagnosis present

## 2014-12-17 SURGERY — INJECTION, BLOOD PATCH, EPIDURAL, BY ANESTHESIOLOGY
Anesthesia: Regional

## 2014-12-17 MED ORDER — FENTANYL CITRATE (PF) 100 MCG/2ML IJ SOLN: 25.0000 ug | INTRAMUSCULAR | Status: DC | PRN

## 2014-12-17 NOTE — Anesthesia Postprocedure Evaluation (Signed)
  Anesthesia Post-op Note  Patient: Tracey Hood  Procedure(s) Performed: Procedure(s): ANESTHESIA BLOOD PATCH (N/A)  Anesthesia type:No value filed.  Patient location: PACU  Post pain: Pain level controlled  Post assessment: Post-op Vital signs reviewed, Patient's Cardiovascular Status Stable, Respiratory Function Stable, Patent Airway and No signs of Nausea or vomiting  Post vital signs: Reviewed and stable  Last Vitals:  Filed Vitals:   12/17/14 2237  BP:   Temp: 37.3 C  Resp:     Level of consciousness: awake, alert  and patient cooperative  Complications: No apparent anesthesia complications

## 2014-12-17 NOTE — Anesthesia Procedure Notes (Signed)
Epidural Blood patch Patient location during procedure: post-op Start time: 12/17/2014 9:52 PM End time: 12/17/2014 10:15 PM  Staffing Anesthesiologist: Zena AmosKEPHART, WILLIAM K Performed by: anesthesiologist   Preanesthetic Checklist Completed: patient identified, surgical consent, timeout performed, IV checked, risks and benefits discussed and monitors and equipment checked  Epidural Patient position: sitting Prep: Betadine Patient monitoring: heart rate, cardiac monitor, continuous pulse ox and blood pressure Approach: midline Location: L4-L5 Injection technique: LOR saline  Needle:  Needle type: Tuohy  Needle gauge: 18 G Needle length: 9 cm Needle insertion depth: 7 cm Test dose: negative and 1.5% lidocaine with Epi 1:200 K  Additional Notes Epidural placed as above. 20 [facility]. 20 ml of blood injected slowly without complication. Pt tolerated the procedure well. Rechecked approximately 20 mins post-procedure and the HA pain was relieved. Pt discharged home with instructions on no lifting greater than 7 lbs. and to continue with increased fluid intake.Reason for block:Epidural Blood Patch

## 2014-12-17 NOTE — Anesthesia Preprocedure Evaluation (Signed)
Anesthesia Evaluation  Patient identified by MRN, date of birth, ID band Patient awake    Reviewed: Allergy & Precautions, H&P , NPO status , Patient's Chart, lab work & pertinent test results, reviewed documented beta blocker date and time   Airway Mallampati: II  TM Distance: >3 FB Neck ROM: full    Dental no notable dental hx.    Pulmonary neg pulmonary ROS,  breath sounds clear to auscultation  Pulmonary exam normal       Cardiovascular Exercise Tolerance: Good negative cardio ROS  Rhythm:regular Rate:Normal     Neuro/Psych negative neurological ROS  negative psych ROS   GI/Hepatic negative GI ROS, Neg liver ROS,   Endo/Other  negative endocrine ROS  Renal/GU negative Renal ROS  negative genitourinary   Musculoskeletal   Abdominal   Peds  Hematology negative hematology ROS (+)   Anesthesia Other Findings Pt s/p wet tap with labor epidural. Positional HA pain from that time. Pt states the pain is much worse with sitting up and was not relieved with conservative tx(lying flat, caffeine, fluids, etc)  Reproductive/Obstetrics (+) Pregnancy                             Anesthesia Physical  Anesthesia Plan  ASA: II  Anesthesia Plan: Regional and Epidural   Post-op Pain Management:    Induction:   Airway Management Planned:   Additional Equipment:   Intra-op Plan:   Post-operative Plan:   Informed Consent: I have reviewed the patients History and Physical, chart, labs and discussed the procedure including the risks, benefits and alternatives for the proposed anesthesia with the patient or authorized representative who has indicated his/her understanding and acceptance.     Plan Discussed with:   Anesthesia Plan Comments: (Will proceed with epidural blood patch. Risks and benefits explained and pt desires to proceed.)        Anesthesia Quick Evaluation

## 2015-01-07 ENCOUNTER — Encounter: Payer: Self-pay | Admitting: Emergency Medicine

## 2015-01-07 ENCOUNTER — Ambulatory Visit
Admission: EM | Admit: 2015-01-07 | Discharge: 2015-01-07 | Disposition: A | Discharge status: Home / Self Care | Payer: Medicaid Other | Attending: Internal Medicine | Admitting: Internal Medicine

## 2015-01-07 DIAGNOSIS — M5416 Radiculopathy, lumbar region: Secondary | ICD-10-CM | POA: Diagnosis not present

## 2015-01-07 DIAGNOSIS — M25552 Pain in left hip: Secondary | ICD-10-CM | POA: Diagnosis present

## 2015-01-07 LAB — URINALYSIS COMPLETE WITH MICROSCOPIC (ARMC ONLY)
BILIRUBIN URINE: NEGATIVE
GLUCOSE, UA: NEGATIVE mg/dL
KETONES UR: NEGATIVE mg/dL
Nitrite: NEGATIVE
Protein, ur: NEGATIVE mg/dL
SPECIFIC GRAVITY, URINE: 1.025 (ref 1.005–1.030)
pH: 6 (ref 5.0–8.0)

## 2015-01-07 MED ORDER — NAPROXEN 500 MG PO TABS: 500.0000 mg | ORAL_TABLET | Freq: Two times a day (BID) | ORAL | Status: DC

## 2015-01-07 NOTE — ED Provider Notes (Signed)
CSN: [id number]     Arrival date & time 01/07/15  4098 History   First MD Initiated Contact with Patient 01/07/15 [phone]     Chief Complaint  Patient presents with  . Hip Pain   (Consider location/radiation/quality/duration/timing/severity/associated sxs/prior Treatment) HPI   This a 24 year old female who 3 months postpartum and uncomplicated delivery of a live child who is concerned regarding dark urine color which she reports is a very dark yellow and sometimes appearing orange. She denies any frequency urgency dysuria or vaginal discharge. She is well hydrated by her account drinking plenty of water daily. She is also concerned about left hip pain that she indicates in the inguinal area does not radiate. She states that as the day progresses and she bears weight on her hip becomes more painful causing a limp and her to shift her weight to her right side. She has no recent injury and states that her delivery was totally uncomplicated vaginal. She had similar hip pain from her first delivery but it resolved quickly with this has persisted and worsened. She denies any numbness or tingling in her lower extremity and has no specific back pain. She reports that she is not breast-feeding.  History reviewed. No pertinent past medical history. History reviewed. No pertinent past surgical history. Family History  Problem Relation Age of Onset  . Alcohol abuse Neg Hx   . Arthritis Neg Hx   . Birth defects Neg Hx   . Cancer Neg Hx   . Drug abuse Neg Hx   . Asthma Neg Hx   . Diabetes Neg Hx   . COPD Neg Hx   . Depression Neg Hx   . Early death Neg Hx   . Hearing loss Neg Hx   . Heart disease Neg Hx   . Hyperlipidemia Neg Hx   . Hypertension Neg Hx   . Kidney disease Neg Hx   . Learning disabilities Neg Hx   . Mental illness Neg Hx   . Mental retardation Neg Hx   . Miscarriages / Stillbirths Neg Hx   . Stroke Neg Hx   . Vision loss Neg Hx   . Varicose Veins Neg Hx    History  Substance  Use Topics  . Smoking status: Never Smoker   . Smokeless tobacco: Never Used  . Alcohol Use: No   OB History    Gravida Para Term Preterm AB TAB SAB Ectopic Multiple Living   0 0 0 0 0 0 1     Review of Systems  Musculoskeletal: Positive for arthralgias and gait problem.  All other systems reviewed and are negative.   Allergies  Review of patient's allergies indicates no known allergies.  Home Medications   Prior to Admission medications   Medication Sig Start Date End Date Taking? Authorizing Provider  cholecalciferol (VITAMIN D) 1000 UNITS tablet Take 1,000 Units by mouth daily.   Yes Historical Provider, MD  naproxen (NAPROSYN) 500 MG tablet Take 1 tablet (500 mg total) by mouth 2 (two) times daily with a meal. 01/07/15   Lutricia Feil, PA-C  oxyCODONE-acetaminophen (PERCOCET/ROXICET) 5-325 MG per tablet Take 1 tablet by mouth every 4 (four) hours as needed (for pain scale 4-7). 12/14/14   Ihor Austin Schermerhorn, MD   BP 129/74 mmHg  Pulse 75  Temp(Src) 97 F (36.1 C) (Tympanic)  Resp 16  Ht  (1.6 m)  Wt 152 lb (68.947 kg)  BMI 26.93 kg/m2  SpO2 100%  Breastfeeding?  No Physical Exam  Constitutional: She is oriented to person, place, and time. She appears well-developed and well-nourished.  HENT:  Head: Normocephalic and atraumatic.  Eyes: EOM are normal. Pupils are equal, round, and reactive to light.  Musculoskeletal:  Examination the patient was carried out with a Adult nurse, RN attendance. Examination of the hips shows good range of motion bilaterally with the pain in the left ear with internal rotation. She has no discomfort in the hip with rolling in full extension. Exam is of her lumbar spine shows a level pelvis in stance. For flexion reproduces her hip pain more than the hip range of motion right lateral flexion also produces left hip pain. Resting rotation is negative. She has no significant paraspinous muscle tenderness in stance.   Neurological: She is alert and oriented to person, place, and time. She has normal reflexes.  Skin: Skin is warm and dry.  Psychiatric: She has a normal mood and affect. Her behavior is normal. Judgment and thought content normal.  Nursing note and vitals reviewed.   ED Course  Procedures (including critical care time) Labs Review Labs Reviewed  URINALYSIS COMPLETEWITH MICROSCOPIC ([facility] ONLY) - Abnormal; Notable for the following:    APPearance HAZY (*)    Hgb urine dipstick 1+ (*)    Leukocytes, UA 1+ (*)    Bacteria, UA FEW (*)    Squamous Epithelial / LPF 0-5 (*)    All other components within normal limits    Imaging Review No results found.   MDM   1. Left lumbar radiculitis   Radiation to left hip New Prescriptions   NAPROXEN (NAPROSYN) 500 MG TABLET    Take 1 tablet (500 mg total) by mouth 2 (two) times daily with a meal.  Plan: 1. Test/x-ray results and diagnosis reviewed with patient 2. rx as per orders; risks, benefits, potential side effects reviewed with patient 3. Recommend supportive treatment with rest and symptom avoidance. 4. F/u prn if symptoms worsen or don't improve. F/U at [facility] clinic.     Lutricia Feil, PA-C 01/07/15 1039

## 2015-01-07 NOTE — Discharge Instructions (Signed)
Back Exercises Back exercises help treat and prevent back injuries. The goal of back exercises is to increase the strength of your abdominal and back muscles and the flexibility of your back. These exercises should be started when you no longer have back pain. Back exercises include:  Pelvic Tilt. Lie on your back with your knees bent. Tilt your pelvis until the lower part of your back is against the floor. Hold this position 5 to 10 sec and repeat 5 to 10 times.  Knee to Chest. Pull first 1 knee up against your chest and hold for 20 to 30 seconds, repeat this with the other knee, and then both knees. This may be done with the other leg straight or bent, whichever feels better.  Sit-Ups or Curl-Ups. Bend your knees 90 degrees. Start with tilting your pelvis, and do a partial, slow sit-up, lifting your trunk only 30 to 45 degrees off the floor. Take at least 2 to 3 seconds for each sit-up. Do not do sit-ups with your knees out straight. If partial sit-ups are difficult, simply do the above but with only tightening your abdominal muscles and holding it as directed.  Hip-Lift. Lie on your back with your knees flexed 90 degrees. Push down with your feet and shoulders as you raise your hips a couple inches off the floor; hold for 10 seconds, repeat 5 to 10 times.  Back arches. Lie on your stomach, propping yourself up on bent elbows. Slowly press on your hands, causing an arch in your low back. Repeat 3 to 5 times. Any initial stiffness and discomfort should lessen with repetition over time.  Shoulder-Lifts. Lie face down with arms beside your body. Keep hips and torso pressed to floor as you slowly lift your head and shoulders off the floor. Do not overdo your exercises, especially in the beginning. Exercises may cause you some mild back discomfort which lasts for a few minutes; however, if the pain is more severe, or lasts for more than 15 minutes, do not continue exercises until you see your caregiver.  Improvement with exercise therapy for back problems is slow.  See your caregivers for assistance with developing a proper back exercise program. Document Released: 08/12/2004 Document Revised: 09/27/2011 Document Reviewed: 05/06/2011 [facility] Patient Information 2015 [place], [place]. This information is not intended to replace advice given to you by your health care provider. Make sure you discuss any questions you have with your health care provider.  Lumbosacral Radiculopathy Lumbosacral radiculopathy is a pinched nerve or nerves in the low back (lumbosacral area). When this happens you may have weakness in your legs and may not be able to stand on your toes. You may have pain going down into your legs. There may be difficulties with walking normally. There are many causes of this problem. Sometimes this may happen from an injury, or simply from arthritis or boney problems. It may also be caused by other illnesses such as diabetes. If there is no improvement after treatment, further studies may be done to find the exact cause. DIAGNOSIS  X-rays may be needed if the problems become long standing. Electromyograms may be done. This study is one in which the working of nerves and muscles is studied. HOME CARE INSTRUCTIONS   Applications of ice packs may be helpful. Ice can be used in a plastic bag with a towel around it to prevent frostbite to skin. This may be used every 2 hours for 20 to 30 minutes, or as needed, while awake, or  as directed by your caregiver. °· Only take over-the-counter or prescription medicines for pain, discomfort, or fever as directed by your caregiver. °· If physical therapy was prescribed, follow your caregiver's directions. °SEEK IMMEDIATE MEDICAL CARE IF:  °· You have pain not controlled with medications. °· You seem to be getting worse rather than better. °· You develop increasing weakness in your legs. °· You develop loss of bowel or bladder control. °· You have difficulty with  walking or balance, or develop clumsiness in the use of your legs. °· You have a fever. °MAKE SURE YOU:  °· Understand these instructions. °· Will watch your condition. °· Will get help right away if you are not doing well or get worse. °Document Released: 07/05/2005 Document Revised: 09/27/2011 Document Reviewed: 02/23/2008 °[employer]® Patient Information ©2015 [employer]. This information is not intended to replace advice given to you by your health care provider. Make sure you discuss any questions you have with your health care provider. ° °

## 2015-01-07 NOTE — ED Notes (Signed)
Patient c/o left hip pain and weakness in her left hip and darker color urine for about a month.  Patient denies fevers.  Patient denies N/V.

## 2015-04-21 ENCOUNTER — Encounter: Payer: Self-pay | Admitting: Emergency Medicine

## 2015-04-21 ENCOUNTER — Ambulatory Visit
Admission: EM | Admit: 2015-04-21 | Discharge: 2015-04-21 | Disposition: A | Discharge status: Home / Self Care | Payer: Medicaid Other | Attending: Family Medicine | Admitting: Family Medicine

## 2015-04-21 DIAGNOSIS — N61 Mastitis without abscess: Secondary | ICD-10-CM

## 2015-04-21 MED ORDER — CEFUROXIME AXETIL 500 MG PO TABS: 500.0000 mg | ORAL_TABLET | Freq: Two times a day (BID) | ORAL | Status: DC

## 2015-04-21 NOTE — ED Provider Notes (Signed)
CSN: [id number]     Arrival date & time 04/21/15  2130 History   First MD Initiated Contact with Patient 04/21/15 [phone]     Chief Complaint  Patient presents with  . Breast Pain   patient reports the left breast started hurting her about 3 days ago. She states this swelling. She breast-fed her child about 2 months has been 2 months since his last breast-fed. She's never had breast tenderness or pain states her mother looked better and if the breast and felt that she needed to be seen and evaluated. (Consider location/radiation/quality/duration/timing/severity/associated sxs/prior Treatment) Patient is a 24 y.o. female presenting with musculoskeletal pain. The history is provided by the patient. No language interpreter was used.  Muscle Pain This is a new problem. The current episode started more than 2 days ago (About 3 days ago). The problem occurs constantly. Pertinent negatives include no chest pain, no abdominal pain and no headaches. Nothing aggravates the symptoms. Nothing relieves the symptoms. The treatment provided no relief.    History reviewed. No pertinent past medical history. History reviewed. No pertinent past surgical history. Family History  Problem Relation Age of Onset  . Alcohol abuse Neg Hx   . Arthritis Neg Hx   . Birth defects Neg Hx   . Cancer Neg Hx   . Drug abuse Neg Hx   . Asthma Neg Hx   . Diabetes Neg Hx   . COPD Neg Hx   . Depression Neg Hx   . Early death Neg Hx   . Hearing loss Neg Hx   . Heart disease Neg Hx   . Hyperlipidemia Neg Hx   . Hypertension Neg Hx   . Kidney disease Neg Hx   . Learning disabilities Neg Hx   . Mental illness Neg Hx   . Mental retardation Neg Hx   . Miscarriages / Stillbirths Neg Hx   . Stroke Neg Hx   . Vision loss Neg Hx   . Varicose Veins Neg Hx    Social History  Substance Use Topics  . Smoking status: Never Smoker   . Smokeless tobacco: Never Used  . Alcohol Use: No   OB History    Gravida Para Term Preterm  AB TAB SAB Ectopic Multiple Living   0 0 0 0 0 0 1     Review of Systems  Cardiovascular: Negative for chest pain.  Gastrointestinal: Negative for abdominal pain.  Neurological: Negative for headaches.  All other systems reviewed and are negative.   Allergies  Review of patient's allergies indicates no known allergies.  Home Medications   Prior to Admission medications   Medication Sig Start Date End Date Taking? Authorizing Provider  cefUROXime (CEFTIN) 500 MG tablet Take 1 tablet (500 mg total) by mouth 2 (two) times daily. 04/21/15   Hassan Rowan, MD  cholecalciferol (VITAMIN D) 1000 UNITS tablet Take 1,000 Units by mouth daily.    Historical Provider, MD  naproxen (NAPROSYN) 500 MG tablet Take 1 tablet (500 mg total) by mouth 2 (two) times daily with a meal. 01/07/15   Lutricia Feil, PA-C  oxyCODONE-acetaminophen (PERCOCET/ROXICET) 5-325 MG per tablet Take 1 tablet by mouth every 4 (four) hours as needed (for pain scale 4-7). 12/14/14   Suzy Bouchard, MD   Meds Ordered and Administered this Visit  Medications - No data to display  BP 103/63 mmHg  Pulse 66  Temp(Src) 97 F (36.1 C) (Tympanic)  Resp 16  Ht  (  1.6 m)  Wt 157 lb (71.215 kg)  BMI 27.82 kg/m2  SpO2 100%  LMP 04/01/2015 (Exact Date) No data found.   Physical Exam  Constitutional: She is oriented to person, place, and time. She appears well-developed and well-nourished.  HENT:  Head: Normocephalic.  Eyes: Conjunctivae are normal. Pupils are equal, round, and reactive to light.  Genitourinary: There is breast swelling, tenderness and discharge. No breast bleeding.  Left breast was warm over the lower lateral border there was some erythema and mild induration present. And even though stopped breast-feeding her child age 34 months 2 months ago some milk was expressed from the left breast.  Musculoskeletal: Normal range of motion.  Neurological: She is alert and oriented to person, place, and  time.  Skin: Skin is warm and dry.  Psychiatric: She has a normal mood and affect.  Vitals reviewed.   ED Course  Procedures (including critical care time)  Labs Review Labs Reviewed - No data to display  Imaging Review No results found.   Visual Acuity Review  Right Eye Distance:   Left Eye Distance:   Bilateral Distance:    Right Eye Near:   Left Eye Near:    Bilateral Near:         MDM   1. Mastitis, left, acute     Place on Ceftin 500 mg twice a day. Work note given for today and tomorrow follow-up PCP if not better in a week   Hassan Rowan, MD 04/21/15 [phone]

## 2015-04-21 NOTE — ED Notes (Signed)
Patient c/o a bruise on the left side of her left breast and tenderness this morning

## 2015-04-21 NOTE — Discharge Instructions (Signed)
Mastitis   Mastitis is redness, soreness, and puffiness (inflammation) in an area of the breast. It is often caused by an infection that occurs when bacteria enter the skin. The infection is often helped by antibiotic medicine.  HOME CARE  · Only take medicines as told by your doctor.  · If your doctor prescribed an antibiotic medicine, take it as told. Finish it even if you start to feel better.  · Do not wear a tight or underwire bra. Wear a soft support bra.  · Drink more fluids, especially if you have a fever.  · If you are breastfeeding:  ¨ Keep emptying the breast. Your doctor can tell you if the milk is safe. Use a breast pump if you are told to stop nursing.  ¨ Keep your nipples clean and dry.  ¨ Empty the first breast before going to the other breast. Use a breast pump if your baby is not emptying your breast.  ¨ If you go back to work, pump your breasts while at work.  ¨ Avoid letting your breasts get overly filled with milk (engorged).  GET HELP IF:  · You have pus-like fluid leaking from your breast.  · Your symptoms do not get better within 2 days.  GET HELP RIGHT AWAY IF:  · Your pain and puffiness are getting worse.  · Your pain is not helped by medicine.  · You have a red line going from your breast toward your armpit.  · You have a fever or lasting symptoms for more than 2-3 days.  · You have a fever and your symptoms suddenly get worse.  MAKE SURE YOU:   · Understand these instructions.  · Will watch your condition.  · Will get help right away if you are not doing well or get worse.  Document Released: 06/23/2009 Document Revised: 03/07/2013 Document Reviewed: 02/02/2013  [employer]® Patient Information ©2015 [employer]. This information is not intended to replace advice given to you by your health care provider. Make sure you discuss any questions you have with your health care provider.

## 2015-04-22 ENCOUNTER — Encounter: Payer: Self-pay | Admitting: Family Medicine

## 2015-04-22 ENCOUNTER — Ambulatory Visit (INDEPENDENT_AMBULATORY_CARE_PROVIDER_SITE_OTHER): Payer: Medicaid Other | Admitting: Family Medicine

## 2015-04-22 ENCOUNTER — Ambulatory Visit: Payer: Self-pay | Admitting: Family Medicine

## 2015-04-22 VITALS — BP 120/70 | HR 72 | Ht 63.0 in | Wt 152.0 lb

## 2015-04-22 DIAGNOSIS — Z7189 Other specified counseling: Secondary | ICD-10-CM | POA: Diagnosis not present

## 2015-04-22 DIAGNOSIS — Z30011 Encounter for initial prescription of contraceptive pills: Secondary | ICD-10-CM

## 2015-04-22 DIAGNOSIS — Z7689 Persons encountering health services in other specified circumstances: Secondary | ICD-10-CM

## 2015-04-22 NOTE — Progress Notes (Signed)
Name: Tracey Hood   MRN: [id number]    DOB: 06/27/1991   Date:04/22/2015       Progress Note  Subjective  Chief Complaint  Chief Complaint  Patient presents with  . Establish Care    HPI Comments: To establish. No subjective nor objective concerns.   No problem-specific assessment & plan notes found for this encounter.   History reviewed. No pertinent past medical history.  History reviewed. No pertinent past surgical history.  Family History  Problem Relation Age of Onset  . Alcohol abuse Neg Hx   . Arthritis Neg Hx   . Birth defects Neg Hx   . Cancer Neg Hx   . Drug abuse Neg Hx   . Asthma Neg Hx   . Diabetes Neg Hx   . COPD Neg Hx   . Depression Neg Hx   . Early death Neg Hx   . Hearing loss Neg Hx   . Heart disease Neg Hx   . Hyperlipidemia Neg Hx   . Kidney disease Neg Hx   . Learning disabilities Neg Hx   . Mental illness Neg Hx   . Mental retardation Neg Hx   . Miscarriages / Stillbirths Neg Hx   . Stroke Neg Hx   . Vision loss Neg Hx   . Varicose Veins Neg Hx   . Hypertension Father     Social History   Social History  . Marital Status: Single    Spouse Name: N/A  . Number of Children: N/A  . Years of Education: N/A   Occupational History  . Not on file.   Social History Main Topics  . Smoking status: Never Smoker   . Smokeless tobacco: Never Used  . Alcohol Use: No  . Drug Use: No  . Sexual Activity: No   Other Topics Concern  . Not on file   Social History Narrative    No Known Allergies   Review of Systems  Constitutional: Negative for fever, chills and weight loss.  HENT: Negative for ear discharge, ear pain and sore throat.   Eyes: Negative for blurred vision.  Respiratory: Negative for cough, sputum production, shortness of breath and wheezing.   Cardiovascular: Negative for chest pain, palpitations and leg swelling.  Gastrointestinal: Negative for heartburn, nausea, abdominal pain, diarrhea, constipation, blood in  stool and melena.  Genitourinary: Negative for dysuria, urgency, frequency and hematuria.  Musculoskeletal: Negative for myalgias, back pain, joint pain and neck pain.  Skin: Negative for rash.  Neurological: Negative for dizziness, tingling, sensory change, focal weakness and headaches.  Endo/Heme/Allergies: Negative for environmental allergies and polydipsia. Does not bruise/bleed easily.  Psychiatric/Behavioral: Negative for depression and suicidal ideas. The patient is not nervous/anxious and does not have insomnia.      Objective  Filed Vitals:   04/22/15 1410  BP: 120/70  Pulse: 72  Height:  (1.6 m)  Weight: 152 lb (68.947 kg)    Physical Exam  Constitutional: She is well-developed, well-nourished, and in no distress. No distress.  HENT:  Head: Normocephalic and atraumatic.  Right Ear: External ear normal.  Left Ear: External ear normal.  Nose: Nose normal.  Mouth/Throat: Oropharynx is clear and moist.  Eyes: Conjunctivae and EOM are normal. Pupils are equal, round, and reactive to light. Right eye exhibits no discharge. Left eye exhibits no discharge.  Neck: Normal range of motion. Neck supple. No JVD present. No thyromegaly present.  Cardiovascular: Normal rate, regular rhythm, normal heart sounds and intact distal pulses.  Exam reveals no gallop and no friction rub.   No murmur heard. Pulmonary/Chest: Effort normal and breath sounds normal. She has no wheezes. She has no rales. She exhibits no tenderness.  Abdominal: Soft. Bowel sounds are normal. She exhibits no mass. There is no tenderness. There is no guarding.  Musculoskeletal: Normal range of motion. She exhibits no edema.  Lymphadenopathy:    She has no cervical adenopathy.  Neurological: She is alert.  Skin: Skin is warm and dry. She is not diaphoretic.  Psychiatric: Mood and affect normal.      Assessment & Plan  Problem List Items Addressed This Visit    None        Dr. Elizabeth Sauer [facility]  04/22/2015

## 2015-08-11 ENCOUNTER — Ambulatory Visit (INDEPENDENT_AMBULATORY_CARE_PROVIDER_SITE_OTHER): Payer: Medicaid Other

## 2015-08-11 DIAGNOSIS — Z23 Encounter for immunization: Secondary | ICD-10-CM | POA: Diagnosis not present

## 2015-11-14 ENCOUNTER — Encounter: Payer: Self-pay | Admitting: Family Medicine

## 2015-11-14 ENCOUNTER — Ambulatory Visit (INDEPENDENT_AMBULATORY_CARE_PROVIDER_SITE_OTHER): Payer: Medicaid Other | Admitting: Family Medicine

## 2015-11-14 VITALS — BP 120/62 | HR 72 | Temp 98.0°F | Ht 63.0 in | Wt 140.0 lb

## 2015-11-14 DIAGNOSIS — J0100 Acute maxillary sinusitis, unspecified: Secondary | ICD-10-CM | POA: Diagnosis not present

## 2015-11-14 DIAGNOSIS — J301 Allergic rhinitis due to pollen: Secondary | ICD-10-CM

## 2015-11-14 MED ORDER — AMOXICILLIN 500 MG PO CAPS: 500.0000 mg | ORAL_CAPSULE | Freq: Three times a day (TID) | ORAL | Status: DC

## 2015-11-14 MED ORDER — MONTELUKAST SODIUM 10 MG PO TABS: 10.0000 mg | ORAL_TABLET | Freq: Every day | ORAL | Status: DC

## 2015-11-14 MED ORDER — FLUTICASONE PROPIONATE 50 MCG/ACT NA SUSP: 2.0000 | Freq: Every day | NASAL | Status: DC

## 2015-11-14 NOTE — Progress Notes (Signed)
Name: Tracey Hood   MRN: [id number]    DOB: 06/10/1991   Date:11/14/2015       Progress Note  Subjective  Chief Complaint  Chief Complaint  Patient presents with  . Sore Throat    wakes up with sore throat in am- gets better throughout the day    Sore Throat  This is a new problem. The current episode started 1 to 4 weeks ago. The problem has been unchanged. There has been no fever. Associated symptoms include congestion, coughing, headaches and a plugged ear sensation. Pertinent negatives include no abdominal pain, diarrhea, ear discharge, ear pain, hoarse voice, neck pain, shortness of breath, stridor or swollen glands. She has had no exposure to strep or mono. She has tried acetaminophen for the symptoms. The treatment provided mild relief.  Sinusitis This is a recurrent problem. The current episode started 1 to 4 weeks ago. There has been no fever. The fever has been present for 3 to 4 days. The pain is mild. Associated symptoms include congestion, coughing and headaches. Pertinent negatives include no chills, ear pain, hoarse voice, neck pain, shortness of breath, sore throat or swollen glands. Past treatments include acetaminophen. The treatment provided mild relief.    No problem-specific assessment & plan notes found for this encounter.   History reviewed. No pertinent past medical history.  History reviewed. No pertinent past surgical history.  Family History  Problem Relation Age of Onset  . Alcohol abuse Neg Hx   . Arthritis Neg Hx   . Birth defects Neg Hx   . Cancer Neg Hx   . Drug abuse Neg Hx   . Asthma Neg Hx   . Diabetes Neg Hx   . COPD Neg Hx   . Depression Neg Hx   . Early death Neg Hx   . Hearing loss Neg Hx   . Heart disease Neg Hx   . Hyperlipidemia Neg Hx   . Kidney disease Neg Hx   . Learning disabilities Neg Hx   . Mental illness Neg Hx   . Mental retardation Neg Hx   . Miscarriages / Stillbirths Neg Hx   . Stroke Neg Hx   . Vision loss  Neg Hx   . Varicose Veins Neg Hx   . Hypertension Father     Social History   Social History  . Marital Status: Single    Spouse Name: N/A  . Number of Children: N/A  . Years of Education: N/A   Occupational History  . Not on file.   Social History Main Topics  . Smoking status: Never Smoker   . Smokeless tobacco: Never Used  . Alcohol Use: No  . Drug Use: No  . Sexual Activity: No   Other Topics Concern  . Not on file   Social History Narrative    No Known Allergies   Review of Systems  Constitutional: Negative for fever, chills, weight loss and malaise/fatigue.  HENT: Positive for congestion. Negative for ear discharge, ear pain, hoarse voice and sore throat.   Eyes: Negative for blurred vision.  Respiratory: Positive for cough. Negative for sputum production, shortness of breath, wheezing and stridor.   Cardiovascular: Negative for chest pain, palpitations and leg swelling.  Gastrointestinal: Negative for heartburn, nausea, abdominal pain, diarrhea, constipation, blood in stool and melena.  Genitourinary: Negative for dysuria, urgency, frequency and hematuria.  Musculoskeletal: Negative for myalgias, back pain, joint pain and neck pain.  Skin: Negative for rash.  Neurological: Positive for headaches.  Negative for dizziness, tingling, sensory change and focal weakness.  Endo/Heme/Allergies: Negative for environmental allergies and polydipsia. Does not bruise/bleed easily.  Psychiatric/Behavioral: Negative for depression and suicidal ideas. The patient is not nervous/anxious and does not have insomnia.      Objective  Filed Vitals:   11/14/15 1355  BP: 120/62  Pulse: 72  Temp: 98 F (36.7 C)  TempSrc: Oral  Height:  (1.6 m)  Weight: 140 lb (63.504 kg)    Physical Exam  Constitutional: She is well-developed, well-nourished, and in no distress. No distress.  HENT:  Head: Normocephalic and atraumatic.  Right Ear: External ear normal.  Left Ear:  External ear normal.  Nose: Mucosal edema present. Right sinus exhibits no maxillary sinus tenderness and no frontal sinus tenderness. Left sinus exhibits no maxillary sinus tenderness and no frontal sinus tenderness.  Mouth/Throat: Oropharynx is clear and moist.  Eyes: Conjunctivae and EOM are normal. Pupils are equal, round, and reactive to light. Right eye exhibits no discharge. Left eye exhibits no discharge.  Neck: Normal range of motion. Neck supple. No JVD present. No thyromegaly present.  Cardiovascular: Normal rate, regular rhythm, normal heart sounds and intact distal pulses.  Exam reveals no gallop and no friction rub.   No murmur heard. Pulmonary/Chest: Effort normal and breath sounds normal.  Abdominal: Soft. Bowel sounds are normal. She exhibits no mass. There is no tenderness. There is no guarding.  Musculoskeletal: Normal range of motion. She exhibits no edema.  Lymphadenopathy:    She has no cervical adenopathy.  Neurological: She is alert. She has normal reflexes.  Skin: Skin is warm and dry. She is not diaphoretic.  Psychiatric: Mood and affect normal.  Nursing note and vitals reviewed.     Assessment & Plan  Problem List Items Addressed This Visit    None    Visit Diagnoses    Acute maxillary sinusitis, recurrence not specified    -  Primary    suggest antihist/decongest    Relevant Medications    fluticasone (FLONASE) 50 MCG/ACT nasal spray    amoxicillin (AMOXIL) 500 MG capsule    Allergic rhinitis due to pollen        Relevant Medications    montelukast (SINGULAIR) 10 MG tablet    fluticasone (FLONASE) 50 MCG/ACT nasal spray         Dr. [facility]  11/14/2015

## 2016-05-26 ENCOUNTER — Encounter: Payer: Self-pay | Admitting: Family Medicine

## 2016-05-26 ENCOUNTER — Ambulatory Visit (INDEPENDENT_AMBULATORY_CARE_PROVIDER_SITE_OTHER): Payer: Medicaid Other | Admitting: Family Medicine

## 2016-05-26 VITALS — BP 120/64 | HR 80 | Temp 98.6°F | Ht 63.0 in | Wt 135.0 lb

## 2016-05-26 DIAGNOSIS — J40 Bronchitis, not specified as acute or chronic: Secondary | ICD-10-CM

## 2016-05-26 DIAGNOSIS — J0100 Acute maxillary sinusitis, unspecified: Secondary | ICD-10-CM | POA: Diagnosis not present

## 2016-05-26 DIAGNOSIS — J301 Allergic rhinitis due to pollen: Secondary | ICD-10-CM | POA: Diagnosis not present

## 2016-05-26 MED ORDER — FLUTICASONE PROPIONATE 50 MCG/ACT NA SUSP: 2.0000 | Freq: Every day | NASAL | 11 refills | Status: DC

## 2016-05-26 MED ORDER — AZITHROMYCIN 250 MG PO TABS: ORAL_TABLET | ORAL | 0 refills | Status: DC

## 2016-05-26 MED ORDER — GUAIFENESIN-CODEINE 100-10 MG/5ML PO SYRP: 5.0000 mL | ORAL_SOLUTION | Freq: Three times a day (TID) | ORAL | 0 refills | Status: DC | PRN

## 2016-05-26 NOTE — Progress Notes (Signed)
Name: Tracey Hood   MRN: [id number]    DOB: 12/11/1990   Date:05/26/2016       Progress Note  Subjective  Chief Complaint  Chief Complaint  Patient presents with  . Cough    non productive x 1 week- tried otc claritin and Robitussin- not helping. Gets worse when talking and trying to sleep at night    Cough  This is a new problem. The current episode started in the past 7 days. The problem has been waxing and waning. The problem occurs every few minutes. The cough is non-productive. Pertinent negatives include no chest pain, chills, ear pain, fever, headaches, heartburn, myalgias, nasal congestion, postnasal drip, rash, sore throat, shortness of breath, weight loss or wheezing. She has tried nothing for the symptoms. The treatment provided mild relief. There is no history of environmental allergies.    No problem-specific Assessment & Plan notes found for this encounter.   No past medical history on file.  No past surgical history on file.  Family History  Problem Relation Age of Onset  . Alcohol abuse Neg Hx   . Arthritis Neg Hx   . Birth defects Neg Hx   . Cancer Neg Hx   . Drug abuse Neg Hx   . Asthma Neg Hx   . Diabetes Neg Hx   . COPD Neg Hx   . Depression Neg Hx   . Early death Neg Hx   . Hearing loss Neg Hx   . Heart disease Neg Hx   . Hyperlipidemia Neg Hx   . Kidney disease Neg Hx   . Learning disabilities Neg Hx   . Mental illness Neg Hx   . Mental retardation Neg Hx   . Miscarriages / Stillbirths Neg Hx   . Stroke Neg Hx   . Vision loss Neg Hx   . Varicose Veins Neg Hx   . Hypertension Father     Social History   Social History  . Marital status: Single    Spouse name: N/A  . Number of children: N/A  . Years of education: N/A   Occupational History  . Not on file.   Social History Main Topics  . Smoking status: Never Smoker  . Smokeless tobacco: Never Used  . Alcohol use No  . Drug use: No  . Sexual activity: No   Other Topics  Concern  . Not on file   Social History Narrative  . No narrative on file    No Known Allergies   Review of Systems  Constitutional: Negative for chills, fever, malaise/fatigue and weight loss.  HENT: Negative for ear discharge, ear pain, postnasal drip and sore throat.   Eyes: Negative for blurred vision.  Respiratory: Negative for cough, sputum production, shortness of breath and wheezing.   Cardiovascular: Negative for chest pain, palpitations and leg swelling.  Gastrointestinal: Negative for abdominal pain, blood in stool, constipation, diarrhea, heartburn, melena and nausea.  Genitourinary: Negative for dysuria, frequency, hematuria and urgency.  Musculoskeletal: Negative for back pain, joint pain, myalgias and neck pain.  Skin: Negative for rash.  Neurological: Negative for dizziness, tingling, sensory change, focal weakness and headaches.  Endo/Heme/Allergies: Negative for environmental allergies and polydipsia. Does not bruise/bleed easily.  Psychiatric/Behavioral: Negative for depression and suicidal ideas. The patient is not nervous/anxious and does not have insomnia.      Objective  Vitals:   05/26/16 0815  BP: 120/64  Pulse: 80  Temp: 98.6 F (37 C)  Weight: 135 lb (61.2  kg)  Height: 5\' 3"  (1.6 m)    Physical Exam  Constitutional: She is well-developed, well-nourished, and in no distress. No distress.  HENT:  Head: Normocephalic and atraumatic.  Right Ear: External ear normal.  Left Ear: External ear normal.  Nose: Nose normal.  Mouth/Throat: Oropharynx is clear and moist.  Eyes: Conjunctivae and EOM are normal. Pupils are equal, round, and reactive to light. Right eye exhibits no discharge. Left eye exhibits no discharge.  Neck: Normal range of motion. Neck supple. No JVD present. No thyromegaly present.  Cardiovascular: Normal rate, regular rhythm, normal heart sounds and intact distal pulses.  Exam reveals no gallop and no friction rub.   No murmur  heard. Pulmonary/Chest: Effort normal and breath sounds normal. She has no wheezes. She has no rales.  Abdominal: Soft. Bowel sounds are normal. She exhibits no mass. There is no tenderness. There is no guarding.  Musculoskeletal: Normal range of motion. She exhibits no edema.  Lymphadenopathy:    She has no cervical adenopathy.  Neurological: She is alert. She has normal reflexes.  Skin: Skin is warm and dry. She is not diaphoretic.  Psychiatric: Mood and affect normal.  Nursing note and vitals reviewed.     Assessment & Plan  Problem List Items Addressed This Visit    None    Visit Diagnoses    Bronchitis    -  Primary   Relevant Medications   guaiFENesin-codeine (ROBITUSSIN AC) 100-10 MG/5ML syrup   Acute non-recurrent maxillary sinusitis       Relevant Medications   azithromycin (ZITHROMAX) 250 MG tablet   fluticasone (FLONASE) 50 MCG/ACT nasal spray   guaiFENesin-codeine (ROBITUSSIN AC) 100-10 MG/5ML syrup   Acute seasonal allergic rhinitis due to pollen       Relevant Medications   fluticasone (FLONASE) 50 MCG/ACT nasal spray    I spent 15 minutes with this patient, More than 50% of that time was spent in face to face education, counseling and care coordination.  Dr. Hayden [facility]  05/26/16

## 2017-04-12 ENCOUNTER — Ambulatory Visit (INDEPENDENT_AMBULATORY_CARE_PROVIDER_SITE_OTHER): Payer: Managed Care, Other (non HMO) | Admitting: Family Medicine

## 2017-04-12 ENCOUNTER — Encounter: Payer: Self-pay | Admitting: Family Medicine

## 2017-04-12 ENCOUNTER — Other Ambulatory Visit: Payer: Self-pay

## 2017-04-12 VITALS — BP 120/64 | HR 68 | Temp 98.5°F | Ht 64.0 in | Wt 153.0 lb

## 2017-04-12 DIAGNOSIS — J40 Bronchitis, not specified as acute or chronic: Secondary | ICD-10-CM

## 2017-04-12 MED ORDER — AZITHROMYCIN 250 MG PO TABS: ORAL_TABLET | ORAL | 0 refills | Status: DC

## 2017-04-12 NOTE — Progress Notes (Signed)
Name: Tracey Hood   MRN: [id number]    DOB: 09/25/1990   Date:04/12/2017       Progress Note  Subjective  Chief Complaint  Chief Complaint  Patient presents with  . Cough    L) ear hurting, sore throat, congestion, nasal drainage    Cough  This is a new problem. The current episode started in the past 7 days. The problem has been gradually worsening. The problem occurs every few hours. The cough is productive of purulent sputum (yellow). Associated symptoms include ear pain, nasal congestion, postnasal drip and a sore throat. Pertinent negatives include no chest pain, chills, fever, headaches, heartburn, myalgias, rash, rhinorrhea, shortness of breath, weight loss or wheezing. Nothing aggravates the symptoms. The treatment provided mild relief. There is no history of asthma, bronchiectasis, bronchitis, emphysema, environmental allergies or pneumonia.    No problem-specific Assessment & Plan notes found for this encounter.   No past medical history on file.  No past surgical history on file.  Family History  Problem Relation Age of Onset  . Alcohol abuse Neg Hx   . Arthritis Neg Hx   . Birth defects Neg Hx   . Cancer Neg Hx   . Drug abuse Neg Hx   . Asthma Neg Hx   . Diabetes Neg Hx   . COPD Neg Hx   . Depression Neg Hx   . Early death Neg Hx   . Hearing loss Neg Hx   . Heart disease Neg Hx   . Hyperlipidemia Neg Hx   . Kidney disease Neg Hx   . Learning disabilities Neg Hx   . Mental illness Neg Hx   . Mental retardation Neg Hx   . Miscarriages / Stillbirths Neg Hx   . Stroke Neg Hx   . Vision loss Neg Hx   . Varicose Veins Neg Hx   . Hypertension Father     Social History   Social History  . Marital status: Single    Spouse name: N/A  . Number of children: N/A  . Years of education: N/A   Occupational History  . Not on file.   Social History Main Topics  . Smoking status: Never Smoker  . Smokeless tobacco: Never Used  . Alcohol use No  . Drug  use: No  . Sexual activity: No   Other Topics Concern  . Not on file   Social History Narrative  . No narrative on file    No Known Allergies  Outpatient Medications Prior to Visit  Medication Sig Dispense Refill  . fluticasone (FLONASE) 50 MCG/ACT nasal spray Place 2 sprays into both nostrils daily. 16 g 11  . azithromycin (ZITHROMAX) 250 MG tablet 2 today then 1 a day for 4 days 6 tablet 0  . guaiFENesin-codeine (ROBITUSSIN AC) 100-10 MG/5ML syrup Take 5 mLs by mouth 3 (three) times daily as needed for cough. 150 mL 0   No facility-administered medications prior to visit.     Review of Systems  Constitutional: Negative for chills, fever, malaise/fatigue and weight loss.  HENT: Positive for ear pain, postnasal drip and sore throat. Negative for ear discharge and rhinorrhea.   Eyes: Negative for blurred vision.  Respiratory: Positive for cough. Negative for sputum production, shortness of breath and wheezing.   Cardiovascular: Negative for chest pain, palpitations and leg swelling.  Gastrointestinal: Negative for abdominal pain, blood in stool, constipation, diarrhea, heartburn, melena and nausea.  Genitourinary: Negative for dysuria, frequency, hematuria and urgency.  Musculoskeletal:  Negative for back pain, joint pain, myalgias and neck pain.  Skin: Negative for rash.  Neurological: Negative for dizziness, tingling, sensory change, focal weakness and headaches.  Endo/Heme/Allergies: Negative for environmental allergies and polydipsia. Does not bruise/bleed easily.  Psychiatric/Behavioral: Negative for depression and suicidal ideas. The patient is not nervous/anxious and does not have insomnia.      Objective  Vitals:   04/12/17 1505  BP: 120/64  Pulse: 68  Temp: 98.5 F (36.9 C)  Weight: 153 lb (69.4 kg)  Height:  (1.626 m)    Physical Exam  Constitutional: She is well-developed, well-nourished, and in no distress. No distress.  HENT:  Head: Normocephalic  and atraumatic.  Right Ear: External ear normal.  Left Ear: External ear normal.  Nose: Nose normal.  Mouth/Throat: Oropharynx is clear and moist.  Eyes: Pupils are equal, round, and reactive to light. Conjunctivae and EOM are normal. Right eye exhibits no discharge. Left eye exhibits no discharge.  Neck: Normal range of motion. Neck supple. No JVD present. No thyromegaly present.  Cardiovascular: Normal rate, regular rhythm, normal heart sounds and intact distal pulses.  Exam reveals no gallop and no friction rub.   No murmur heard. Pulmonary/Chest: Effort normal and breath sounds normal. She has no wheezes. She has no rales.  Abdominal: Soft. Bowel sounds are normal. She exhibits no mass. There is no tenderness. There is no guarding.  Musculoskeletal: Normal range of motion. She exhibits no edema.  Lymphadenopathy:    She has no cervical adenopathy.  Neurological: She is alert. She has normal reflexes.  Skin: Skin is warm and dry. She is not diaphoretic.  Psychiatric: Mood and affect normal.  Nursing note and vitals reviewed.     Assessment & Plan  Problem List Items Addressed This Visit    None    Visit Diagnoses    Bronchitis    -  Primary      Meds ordered this encounter  Medications  . azithromycin (ZITHROMAX) 250 MG tablet    Sig: 2 today  Then 1 a day for 4 days    Dispense:  6 tablet    Refill:  0      Dr. [facility]  04/12/17

## 2017-06-05 ENCOUNTER — Other Ambulatory Visit: Payer: Self-pay | Admitting: Family Medicine

## 2017-06-05 DIAGNOSIS — J0100 Acute maxillary sinusitis, unspecified: Secondary | ICD-10-CM

## 2017-06-05 DIAGNOSIS — J301 Allergic rhinitis due to pollen: Secondary | ICD-10-CM

## 2017-07-08 ENCOUNTER — Other Ambulatory Visit: Payer: Self-pay

## 2017-07-08 DIAGNOSIS — J301 Allergic rhinitis due to pollen: Secondary | ICD-10-CM

## 2017-07-08 DIAGNOSIS — J0100 Acute maxillary sinusitis, unspecified: Secondary | ICD-10-CM

## 2017-07-08 MED ORDER — FLUTICASONE PROPIONATE 50 MCG/ACT NA SUSP: 2.0000 | Freq: Every day | NASAL | 0 refills | Status: DC

## 2017-07-14 ENCOUNTER — Other Ambulatory Visit: Payer: Self-pay

## 2017-07-14 DIAGNOSIS — J0100 Acute maxillary sinusitis, unspecified: Secondary | ICD-10-CM

## 2017-07-14 DIAGNOSIS — J301 Allergic rhinitis due to pollen: Secondary | ICD-10-CM

## 2017-07-21 MED ORDER — FLUTICASONE PROPIONATE 50 MCG/ACT NA SUSP: 2.0000 | Freq: Every day | NASAL | 2 refills | Status: DC

## 2017-08-05 ENCOUNTER — Encounter: Payer: Self-pay | Admitting: Family Medicine

## 2017-08-05 ENCOUNTER — Ambulatory Visit: Payer: BLUE CROSS/BLUE SHIELD | Admitting: Family Medicine

## 2017-08-05 VITALS — BP 118/70 | HR 80 | Ht 63.0 in | Wt 126.0 lb

## 2017-08-05 DIAGNOSIS — R634 Abnormal weight loss: Secondary | ICD-10-CM | POA: Diagnosis not present

## 2017-08-05 NOTE — Progress Notes (Signed)
Name: Tracey Hood   MRN: [id number]    DOB: 06/25/1991   Date:08/05/2017       Progress Note  Subjective  Chief Complaint  Chief Complaint  Patient presents with  . Weight Loss    has had a 30 pound weight loss in 4 months- not trying. Eating normal. Dad has diabetes. Very fatigued, finds it hard to get up and go to work.    Thyroid Problem  Presents for follow-up visit. Symptoms include anxiety, dry skin, fatigue and weight loss. Patient reports no cold intolerance, constipation, depressed mood, diaphoresis, diarrhea, hair loss, heat intolerance, hoarse voice, leg swelling, menstrual problem, nail problem, palpitations, tremors, visual change or weight gain.    No problem-specific Assessment & Plan notes found for this encounter.   History reviewed. No pertinent past medical history.  History reviewed. No pertinent surgical history.  Family History  Problem Relation Age of Onset  . Alcohol abuse Neg Hx   . Arthritis Neg Hx   . Birth defects Neg Hx   . Cancer Neg Hx   . Drug abuse Neg Hx   . Asthma Neg Hx   . Diabetes Neg Hx   . COPD Neg Hx   . Depression Neg Hx   . Early death Neg Hx   . Hearing loss Neg Hx   . Heart disease Neg Hx   . Hyperlipidemia Neg Hx   . Kidney disease Neg Hx   . Learning disabilities Neg Hx   . Mental illness Neg Hx   . Mental retardation Neg Hx   . Miscarriages / Stillbirths Neg Hx   . Stroke Neg Hx   . Vision loss Neg Hx   . Varicose Veins Neg Hx   . Hypertension Father     Social History   Socioeconomic History  . Marital status: Single    Spouse name: Not on file  . Number of children: Not on file  . Years of education: Not on file  . Highest education level: Not on file  Social Needs  . Financial resource strain: Not on file  . Food insecurity - worry: Not on file  . Food insecurity - inability: Not on file  . Transportation needs - medical: Not on file  . Transportation needs - non-medical: Not on file   Occupational History  . Not on file  Tobacco Use  . Smoking status: Never Smoker  . Smokeless tobacco: Never Used  Substance and Sexual Activity  . Alcohol use: No  . Drug use: No  . Sexual activity: No    Birth control/protection: Injection  Other Topics Concern  . Not on file  Social History Narrative  . Not on file    No Known Allergies  Outpatient Medications Prior to Visit  Medication Sig Dispense Refill  . Multiple Vitamins-Calcium (DAILY VITAMINS FOR WOMEN PO) Take 1 each by mouth daily.    . fluticasone (FLONASE) 50 MCG/ACT nasal spray Place 2 sprays into both nostrils daily. (Patient not taking: Reported on 08/05/2017) 16 g 2  . azithromycin (ZITHROMAX) 250 MG tablet 2 today  Then 1 a day for 4 days 6 tablet 0   No facility-administered medications prior to visit.     Review of Systems  Constitutional: Positive for fatigue, malaise/fatigue and weight loss. Negative for chills, diaphoresis, fever and weight gain.  HENT: Negative for ear discharge, ear pain, hearing loss, hoarse voice, nosebleeds, sinus pain, sore throat and tinnitus.   Eyes: Positive for blurred vision.  Negative for double vision, photophobia, pain, discharge and redness.  Respiratory: Negative for cough, hemoptysis, sputum production, shortness of breath, wheezing and stridor.   Cardiovascular: Negative for chest pain, palpitations, orthopnea, claudication, leg swelling and PND.  Gastrointestinal: Negative for abdominal pain, blood in stool, constipation, diarrhea, heartburn, melena, nausea and vomiting.  Genitourinary: Negative for dysuria, flank pain, frequency, hematuria, menstrual problem and urgency.  Musculoskeletal: Negative for back pain, falls, joint pain, myalgias and neck pain.  Skin: Negative for itching and rash.  Neurological: Negative for dizziness, tingling, tremors, sensory change, speech change, focal weakness, seizures, loss of consciousness, weakness and headaches.   Endo/Heme/Allergies: Negative for environmental allergies, cold intolerance, heat intolerance and polydipsia. Does not bruise/bleed easily.  Psychiatric/Behavioral: Negative for depression, hallucinations, memory loss, substance abuse and suicidal ideas. The patient is nervous/anxious. The patient does not have insomnia.      Objective  Vitals:   08/05/17 1430  BP: 118/70  Pulse: 80  Weight: 126 lb (57.2 kg)  Height: 5\' 3"  (1.6 m)    Physical Exam  Constitutional: She is well-developed, well-nourished, and in no distress. No distress.  HENT:  Head: Normocephalic and atraumatic.  Right Ear: External ear normal.  Left Ear: External ear normal.  Nose: Nose normal.  Mouth/Throat: Oropharynx is clear and moist.  Eyes: Conjunctivae and EOM are normal. Pupils are equal, round, and reactive to light. Right eye exhibits no discharge. Left eye exhibits no discharge.  Neck: Normal range of motion. Neck supple. No JVD present. No thyromegaly present.  Cardiovascular: Normal rate, regular rhythm, normal heart sounds and intact distal pulses. Exam reveals no gallop and no friction rub.  No murmur heard. Pulmonary/Chest: Effort normal and breath sounds normal. No respiratory distress. She has no wheezes. She has no rales. She exhibits no tenderness.  Abdominal: Soft. Bowel sounds are normal. She exhibits no distension and no mass. There is no tenderness. There is no rebound and no guarding.  Musculoskeletal: Normal range of motion. She exhibits no edema.  Lymphadenopathy:    She has no cervical adenopathy.  Neurological: She is alert. No cranial nerve deficit. She exhibits normal muscle tone.  Skin: Skin is warm and dry. No rash noted. She is not diaphoretic. No erythema. No pallor.  Psychiatric: Mood and affect normal.  Nursing note and vitals reviewed.     Assessment & Plan  Problem List Items Addressed This Visit    None    Visit Diagnoses    Weight loss, non-intentional    -   Primary   Relevant Orders   CBC with Differential/Platelet   TSH   Hepatic Function Panel (6)   Renal Function Panel   Sedimentation rate      No orders of the defined types were placed in this encounter.     Dr. [facility]  08/05/17

## 2017-08-06 LAB — CBC WITH DIFFERENTIAL/PLATELET
BASOS ABS: 0 10*3/uL (ref 0.0–0.2)
Basos: 1 %
EOS (ABSOLUTE): 0.1 10*3/uL (ref 0.0–0.4)
Eos: 1 %
Hematocrit: 38 % (ref 34.0–46.6)
Hemoglobin: 12.7 g/dL (ref 11.1–15.9)
IMMATURE GRANS (ABS): 0 10*3/uL (ref 0.0–0.1)
IMMATURE GRANULOCYTES: 0 %
LYMPHS: 34 %
Lymphocytes Absolute: 2.4 10*3/uL (ref 0.7–3.1)
MCH: 31.5 pg (ref 26.6–33.0)
MCHC: 33.4 g/dL (ref 31.5–35.7)
MCV: 94 fL (ref 79–97)
Monocytes Absolute: 0.7 10*3/uL (ref 0.1–0.9)
Monocytes: 10 %
NEUTROS PCT: 54 %
Neutrophils Absolute: 3.9 10*3/uL (ref 1.4–7.0)
Platelets: 199 10*3/uL (ref 150–379)
RBC: 4.03 x10E6/uL (ref 3.77–5.28)
RDW: 13.7 % (ref 12.3–15.4)
WBC: 7.1 10*3/uL (ref 3.4–10.8)

## 2017-08-06 LAB — HEPATIC FUNCTION PANEL (6)
ALK PHOS: 50 IU/L (ref 39–117)
ALT: 16 IU/L (ref 0–32)
AST: 16 IU/L (ref 0–40)
BILIRUBIN TOTAL: 0.8 mg/dL (ref 0.0–1.2)
BILIRUBIN, DIRECT: 0.23 mg/dL (ref 0.00–0.40)

## 2017-08-06 LAB — RENAL FUNCTION PANEL
ALBUMIN: 4.6 g/dL (ref 3.5–5.5)
BUN/Creatinine Ratio: 17 (ref 9–23)
BUN: 14 mg/dL (ref 6–20)
CHLORIDE: 106 mmol/L (ref 96–106)
CO2: 18 mmol/L — AB (ref 20–29)
Calcium: 9.3 mg/dL (ref 8.7–10.2)
Creatinine, Ser: 0.83 mg/dL (ref 0.57–1.00)
GFR, EST AFRICAN AMERICAN: 111 mL/min/{1.73_m2} (ref >59)
GFR, EST NON AFRICAN AMERICAN: 96 mL/min/{1.73_m2} (ref >59)
Glucose: 73 mg/dL (ref 65–99)
Phosphorus: 3.4 mg/dL (ref 2.5–4.5)
Potassium: 4.3 mmol/L (ref 3.5–5.2)
Sodium: 140 mmol/L (ref 134–144)

## 2017-08-06 LAB — TSH: TSH: 0.608 u[IU]/mL (ref 0.450–4.500)

## 2017-08-06 LAB — SEDIMENTATION RATE: Sed Rate: 5 mm/hr (ref 0–32)

## 2017-09-28 ENCOUNTER — Ambulatory Visit: Payer: BLUE CROSS/BLUE SHIELD | Admitting: Family Medicine

## 2017-09-28 ENCOUNTER — Encounter: Payer: Self-pay | Admitting: Family Medicine

## 2017-09-28 VITALS — BP 90/60 | HR 60 | Temp 99.0°F | Ht 63.0 in | Wt 130.0 lb

## 2017-09-28 DIAGNOSIS — J0100 Acute maxillary sinusitis, unspecified: Secondary | ICD-10-CM | POA: Diagnosis not present

## 2017-09-28 MED ORDER — AZITHROMYCIN 250 MG PO TABS: ORAL_TABLET | ORAL | 0 refills | Status: DC

## 2017-09-28 NOTE — Progress Notes (Signed)
Name: Tracey Hood   MRN: [id number]    DOB: 02/19/1991   Date:09/28/2017       Progress Note  Subjective  Chief Complaint  Chief Complaint  Patient presents with  . Sinusitis    cong, production without color, sneezing, low grade fever    Sinusitis  This is a new problem. The current episode started in the past 7 days (saturday). The problem has been gradually improving since onset. The maximum temperature recorded prior to her arrival was 100.4 - 100.9 F (low grade). The fever has been present for less than 1 day. The pain is mild. Associated symptoms include chills, congestion, coughing, headaches, neck pain and sinus pressure. Pertinent negatives include no diaphoresis, ear pain, hoarse voice, shortness of breath, sneezing, sore throat or swollen glands. Past treatments include acetaminophen and oral decongestants. The treatment provided moderate relief.    No problem-specific Assessment & Plan notes found for this encounter.   No past medical history on file.  No past surgical history on file.  Family History  Problem Relation Age of Onset  . Alcohol abuse Neg Hx   . Arthritis Neg Hx   . Birth defects Neg Hx   . Cancer Neg Hx   . Drug abuse Neg Hx   . Asthma Neg Hx   . Diabetes Neg Hx   . COPD Neg Hx   . Depression Neg Hx   . Early death Neg Hx   . Hearing loss Neg Hx   . Heart disease Neg Hx   . Hyperlipidemia Neg Hx   . Kidney disease Neg Hx   . Learning disabilities Neg Hx   . Mental illness Neg Hx   . Mental retardation Neg Hx   . Miscarriages / Stillbirths Neg Hx   . Stroke Neg Hx   . Vision loss Neg Hx   . Varicose Veins Neg Hx   . Hypertension Father     Social History   Socioeconomic History  . Marital status: Single    Spouse name: Not on file  . Number of children: Not on file  . Years of education: Not on file  . Highest education level: Not on file  Social Needs  . Financial resource strain: Not on file  . Food insecurity - worry:  Not on file  . Food insecurity - inability: Not on file  . Transportation needs - medical: Not on file  . Transportation needs - non-medical: Not on file  Occupational History  . Not on file  Tobacco Use  . Smoking status: Never Smoker  . Smokeless tobacco: Never Used  Substance and Sexual Activity  . Alcohol use: No  . Drug use: No  . Sexual activity: No    Birth control/protection: Injection  Other Topics Concern  . Not on file  Social History Narrative  . Not on file    No Known Allergies  Outpatient Medications Prior to Visit  Medication Sig Dispense Refill  . fluticasone (FLONASE) 50 MCG/ACT nasal spray Place 2 sprays into both nostrils daily. 16 g 2  . Multiple Vitamins-Calcium (DAILY VITAMINS FOR WOMEN PO) Take 1 each by mouth daily.     No facility-administered medications prior to visit.     Review of Systems  Constitutional: Positive for chills. Negative for diaphoresis, fever, malaise/fatigue and weight loss.  HENT: Positive for congestion and sinus pressure. Negative for ear discharge, ear pain, hoarse voice, sneezing and sore throat.   Eyes: Negative for blurred vision.  Respiratory: Positive for cough. Negative for sputum production, shortness of breath and wheezing.   Cardiovascular: Negative for chest pain, palpitations and leg swelling.  Gastrointestinal: Negative for abdominal pain, blood in stool, constipation, diarrhea, heartburn, melena and nausea.  Genitourinary: Negative for dysuria, frequency, hematuria and urgency.  Musculoskeletal: Positive for neck pain. Negative for back pain, joint pain and myalgias.  Skin: Negative for rash.  Neurological: Positive for headaches. Negative for dizziness, tingling, sensory change and focal weakness.  Endo/Heme/Allergies: Negative for environmental allergies and polydipsia. Does not bruise/bleed easily.  Psychiatric/Behavioral: Negative for depression and suicidal ideas. The patient is not nervous/anxious and does  not have insomnia.      Objective  Vitals:   09/28/17 1035  BP: 90/60  Pulse: 60  Temp: 99 F (37.2 C)  TempSrc: Oral  Weight: 130 lb (59 kg)  Height: 5\' 3"  (1.6 m)    Physical Exam  Constitutional: She is well-developed, well-nourished, and in no distress. No distress.  HENT:  Head: Normocephalic and atraumatic.  Right Ear: Tympanic membrane, external ear and ear canal normal.  Left Ear: Tympanic membrane, external ear and ear canal normal.  Nose: Mucosal edema and rhinorrhea present. Right sinus exhibits maxillary sinus tenderness. Left sinus exhibits maxillary sinus tenderness.  Mouth/Throat: Posterior oropharyngeal erythema present. No oropharyngeal exudate or posterior oropharyngeal edema.  Eyes: Conjunctivae and EOM are normal. Pupils are equal, round, and reactive to light. Right eye exhibits no discharge. Left eye exhibits no discharge.  Neck: Normal range of motion. Neck supple. No JVD present. No thyromegaly present.  Cardiovascular: Normal rate, regular rhythm, normal heart sounds and intact distal pulses. Exam reveals no gallop and no friction rub.  No murmur heard. Pulmonary/Chest: Effort normal and breath sounds normal. She has no wheezes. She has no rales.  Abdominal: Soft. Bowel sounds are normal. She exhibits no mass. There is no tenderness. There is no guarding.  Musculoskeletal: Normal range of motion. She exhibits no edema.  Lymphadenopathy:       Head (right side): Submandibular adenopathy present.       Head (left side): Submandibular adenopathy present.    She has no cervical adenopathy.  tender  Neurological: She is alert.  Skin: Skin is warm and dry. She is not diaphoretic.  Psychiatric: Mood and affect normal.  Nursing note and vitals reviewed.     Assessment & Plan  Problem List Items Addressed This Visit    None    Visit Diagnoses    Acute maxillary sinusitis, recurrence not specified    -  Primary   Relevant Medications   azithromycin  (ZITHROMAX) 250 MG tablet      Meds ordered this encounter  Medications  . azithromycin (ZITHROMAX) 250 MG tablet    Sig: 2 today then 1 a day for 4 days    Dispense:  6 tablet    Refill:  0      Dr. [facility]  09/28/17

## 2018-01-02 ENCOUNTER — Encounter: Payer: Self-pay | Admitting: Family Medicine

## 2018-01-02 ENCOUNTER — Ambulatory Visit: Payer: BLUE CROSS/BLUE SHIELD | Admitting: Family Medicine

## 2018-01-02 VITALS — BP 100/52 | HR 76 | Temp 98.8°F | Ht 63.0 in | Wt 139.0 lb

## 2018-01-02 DIAGNOSIS — J40 Bronchitis, not specified as acute or chronic: Secondary | ICD-10-CM

## 2018-01-02 DIAGNOSIS — J0100 Acute maxillary sinusitis, unspecified: Secondary | ICD-10-CM

## 2018-01-02 MED ORDER — GUAIFENESIN-CODEINE 100-10 MG/5ML PO SYRP: 5.0000 mL | ORAL_SOLUTION | Freq: Three times a day (TID) | ORAL | 0 refills | Status: DC | PRN

## 2018-01-02 MED ORDER — AMOXICILLIN-POT CLAVULANATE 875-125 MG PO TABS
1.0000 | ORAL_TABLET | Freq: Two times a day (BID) | ORAL | 0 refills | Status: DC
Start: 2018-01-02 — End: 2018-03-30

## 2018-01-02 NOTE — Progress Notes (Signed)
Name: Tracey Hood   MRN: [id number]    DOB: January 07, 1991   Date:01/02/2018       Progress Note  Subjective  Chief Complaint  Chief Complaint  Patient presents with  . Sinusitis    dry cough and cong, kids have been sick- she gets worse at night    Sinusitis  This is a new problem. The current episode started in the past 7 days (last week). The problem has been gradually worsening since onset. There has been no fever. Her pain is at a severity of 0/10. The pain is mild. Associated symptoms include congestion, coughing and sinus pressure. Pertinent negatives include no chills, diaphoresis, ear pain, headaches, hoarse voice, neck pain, shortness of breath, sneezing, sore throat or swollen glands. Past treatments include nothing. The treatment provided moderate relief.  Cough  This is a new problem. The current episode started in the past 7 days. The problem has been gradually worsening. The problem occurs every few minutes. The cough is productive of purulent sputum (yellow). Associated symptoms include ear congestion. Pertinent negatives include no chest pain, chills, ear pain, fever, headaches, heartburn, hemoptysis, myalgias, nasal congestion, postnasal drip, rash, rhinorrhea, sore throat, shortness of breath, sweats, weight loss or wheezing. Nothing aggravates the symptoms. She has tried OTC cough suppressant for the symptoms. The treatment provided no relief. There is no history of environmental allergies.    No problem-specific Assessment & Plan notes found for this encounter.   No past medical history on file.  No past surgical history on file.  Family History  Problem Relation Age of Onset  . Alcohol abuse Neg Hx   . Arthritis Neg Hx   . Birth defects Neg Hx   . Cancer Neg Hx   . Drug abuse Neg Hx   . Asthma Neg Hx   . Diabetes Neg Hx   . COPD Neg Hx   . Depression Neg Hx   . Early death Neg Hx   . Hearing loss Neg Hx   . Heart disease Neg Hx   . Hyperlipidemia Neg Hx    . Kidney disease Neg Hx   . Learning disabilities Neg Hx   . Mental illness Neg Hx   . Mental retardation Neg Hx   . Miscarriages / Stillbirths Neg Hx   . Stroke Neg Hx   . Vision loss Neg Hx   . Varicose Veins Neg Hx   . Hypertension Father     Social History   Socioeconomic History  . Marital status: Single    Spouse name: Not on file  . Number of children: Not on file  . Years of education: Not on file  . Highest education level: Not on file  Occupational History  . Not on file  Social Needs  . Financial resource strain: Not on file  . Food insecurity:    Worry: Not on file    Inability: Not on file  . Transportation needs:    Medical: Not on file    Non-medical: Not on file  Tobacco Use  . Smoking status: Never Smoker  . Smokeless tobacco: Never Used  Substance and Sexual Activity  . Alcohol use: No  . Drug use: No  . Sexual activity: Never    Birth control/protection: Injection  Lifestyle  . Physical activity:    Days per week: Not on file    Minutes per session: Not on file  . Stress: Not on file  Relationships  . Social connections:  Talks on phone: Not on file    Gets together: Not on file    Attends religious service: Not on file    Active member of club or organization: Not on file    Attends meetings of clubs or organizations: Not on file    Relationship status: Not on file  . Intimate partner violence:    Fear of current or ex partner: Not on file    Emotionally abused: Not on file    Physically abused: Not on file    Forced sexual activity: Not on file  Other Topics Concern  . Not on file  Social History Narrative  . Not on file    No Known Allergies  Outpatient Medications Prior to Visit  Medication Sig Dispense Refill  . fluticasone (FLONASE) 50 MCG/ACT nasal spray Place 2 sprays into both nostrils daily. 16 g 2  . Multiple Vitamins-Calcium (DAILY VITAMINS FOR WOMEN PO) Take 1 each by mouth daily.    Marland Kitchen. azithromycin (ZITHROMAX) 250  MG tablet 2 today then 1 a day for 4 days 6 tablet 0   No facility-administered medications prior to visit.     Review of Systems  Constitutional: Negative for chills, diaphoresis, fever, malaise/fatigue and weight loss.  HENT: Positive for congestion and sinus pressure. Negative for ear discharge, ear pain, hoarse voice, postnasal drip, rhinorrhea, sneezing and sore throat.   Eyes: Negative for blurred vision.  Respiratory: Positive for cough. Negative for hemoptysis, sputum production, shortness of breath and wheezing.   Cardiovascular: Negative for chest pain, palpitations and leg swelling.  Gastrointestinal: Negative for abdominal pain, blood in stool, constipation, diarrhea, heartburn, melena and nausea.  Genitourinary: Negative for dysuria, frequency, hematuria and urgency.  Musculoskeletal: Negative for back pain, joint pain, myalgias and neck pain.  Skin: Negative for rash.  Neurological: Negative for dizziness, tingling, sensory change, focal weakness and headaches.  Endo/Heme/Allergies: Negative for environmental allergies and polydipsia. Does not bruise/bleed easily.  Psychiatric/Behavioral: Negative for depression and suicidal ideas. The patient is not nervous/anxious and does not have insomnia.      Objective  Vitals:   01/02/18 1432  BP: (!) 100/52  Pulse: 76  Temp: 98.8 F (37.1 C)  TempSrc: Oral  Weight: 139 lb (63 kg)  Height: 5\' 3"  (1.6 m)    Physical Exam  Constitutional: No distress.  HENT:  Head: Normocephalic and atraumatic.  Right Ear: External ear normal. Tympanic membrane is retracted.  Left Ear: External ear normal. Tympanic membrane is retracted.  Nose: Mucosal edema present. Right sinus exhibits maxillary sinus tenderness. Right sinus exhibits no frontal sinus tenderness. Left sinus exhibits maxillary sinus tenderness. Left sinus exhibits no frontal sinus tenderness.  Mouth/Throat: Oropharynx is clear and moist.  Eyes: Pupils are equal, round, and  reactive to light. Conjunctivae and EOM are normal. Right eye exhibits no discharge. Left eye exhibits no discharge.  Neck: Normal range of motion. Neck supple. No JVD present. No thyromegaly present.  Cardiovascular: Normal rate, regular rhythm, normal heart sounds and intact distal pulses. Exam reveals no gallop and no friction rub.  No murmur heard. Pulmonary/Chest: Effort normal and breath sounds normal. She has no wheezes.  Abdominal: Soft. Bowel sounds are normal. She exhibits no mass. There is no tenderness. There is no guarding.  Musculoskeletal: Normal range of motion. She exhibits no edema.  Lymphadenopathy:    She has no cervical adenopathy.  Neurological: She is alert. She has normal reflexes.  Skin: Skin is warm and dry. She is not  diaphoretic.  Nursing note and vitals reviewed.     Assessment & Plan  Problem List Items Addressed This Visit    None    Visit Diagnoses    Acute maxillary sinusitis, recurrence not specified    -  Primary   Sinustis of bilateral maxillary will treat with augmentin 875 bid for 10 days   Relevant Medications   amoxicillin-clavulanate (AUGMENTIN) 875-125 MG tablet   guaiFENesin-codeine (ROBITUSSIN AC) 100-10 MG/5ML syrup   Bronchitis       Cough unable to sleep at night will cover with robitussin AC 1 tsp qid prn cough.    Relevant Medications   guaiFENesin-codeine (ROBITUSSIN AC) 100-10 MG/5ML syrup      Meds ordered this encounter  Medications  . amoxicillin-clavulanate (AUGMENTIN) 875-125 MG tablet    Sig: Take 1 tablet by mouth 2 (two) times daily.    Dispense:  20 tablet    Refill:  0  . guaiFENesin-codeine (ROBITUSSIN AC) 100-10 MG/5ML syrup    Sig: Take 5 mLs by mouth 3 (three) times daily as needed for cough.    Dispense:  150 mL    Refill:  0      Dr. Elizabeth Sauer [facility]  01/02/18

## 2018-03-30 ENCOUNTER — Ambulatory Visit: Payer: BLUE CROSS/BLUE SHIELD | Admitting: Family Medicine

## 2018-03-30 ENCOUNTER — Encounter: Payer: Self-pay | Admitting: Family Medicine

## 2018-03-30 VITALS — BP 130/70 | HR 76 | Ht 63.0 in | Wt 138.0 lb

## 2018-03-30 DIAGNOSIS — J0100 Acute maxillary sinusitis, unspecified: Secondary | ICD-10-CM

## 2018-03-30 DIAGNOSIS — J40 Bronchitis, not specified as acute or chronic: Secondary | ICD-10-CM

## 2018-03-30 MED ORDER — AMOXICILLIN-POT CLAVULANATE 875-125 MG PO TABS: 1.0000 | ORAL_TABLET | Freq: Two times a day (BID) | ORAL | 0 refills | Status: DC

## 2018-03-30 MED ORDER — GUAIFENESIN-CODEINE 100-10 MG/5ML PO SYRP: 5.0000 mL | ORAL_SOLUTION | Freq: Three times a day (TID) | ORAL | 0 refills | Status: DC | PRN

## 2018-03-30 NOTE — Progress Notes (Signed)
Name: Tracey Hood   MRN: [id number]    DOB: 09-Apr-1991   Date:03/30/2018       Progress Note  Subjective  Chief Complaint  Chief Complaint  Patient presents with  . Sinusitis    started last week and half- feels congested and pressure in head.- dry production with cough    Sinusitis  This is a new problem. The current episode started 1 to 4 weeks ago. The problem is unchanged. The maximum temperature recorded prior to her arrival was 100.4 - 100.9 F. The fever has been present for 1 to 2 days. Her pain is at a severity of 3/10. The pain is mild. Associated symptoms include chills, congestion, coughing, headaches, a hoarse voice, sinus pressure, sneezing, a sore throat and swollen glands. Pertinent negatives include no diaphoresis, ear pain, neck pain or shortness of breath. (Sinus drainage yellow/green/blood.) Past treatments include acetaminophen and oral decongestants. The treatment provided mild relief.    No problem-specific Assessment & Plan notes found for this encounter.   No past medical history on file.  No past surgical history on file.  Family History  Problem Relation Age of Onset  . Alcohol abuse Neg Hx   . Arthritis Neg Hx   . Birth defects Neg Hx   . Cancer Neg Hx   . Drug abuse Neg Hx   . Asthma Neg Hx   . Diabetes Neg Hx   . COPD Neg Hx   . Depression Neg Hx   . Early death Neg Hx   . Hearing loss Neg Hx   . Heart disease Neg Hx   . Hyperlipidemia Neg Hx   . Kidney disease Neg Hx   . Learning disabilities Neg Hx   . Mental illness Neg Hx   . Mental retardation Neg Hx   . Miscarriages / Stillbirths Neg Hx   . Stroke Neg Hx   . Vision loss Neg Hx   . Varicose Veins Neg Hx   . Hypertension Father     Social History   Socioeconomic History  . Marital status: Single    Spouse name: Not on file  . Number of children: Not on file  . Years of education: Not on file  . Highest education level: Not on file  Occupational History  . Not on file   Social Needs  . Financial resource strain: Not on file  . Food insecurity:    Worry: Not on file    Inability: Not on file  . Transportation needs:    Medical: Not on file    Non-medical: Not on file  Tobacco Use  . Smoking status: Never Smoker  . Smokeless tobacco: Never Used  Substance and Sexual Activity  . Alcohol use: No  . Drug use: No  . Sexual activity: Never    Birth control/protection: Injection  Lifestyle  . Physical activity:    Days per week: Not on file    Minutes per session: Not on file  . Stress: Not on file  Relationships  . Social connections:    Talks on phone: Not on file    Gets together: Not on file    Attends religious service: Not on file    Active member of club or organization: Not on file    Attends meetings of clubs or organizations: Not on file    Relationship status: Not on file  . Intimate partner violence:    Fear of current or ex partner: Not on file  Emotionally abused: Not on file    Physically abused: Not on file    Forced sexual activity: Not on file  Other Topics Concern  . Not on file  Social History Narrative  . Not on file    No Known Allergies  Outpatient Medications Prior to Visit  Medication Sig Dispense Refill  . fluticasone (FLONASE) 50 MCG/ACT nasal spray Place 2 sprays into both nostrils daily. 16 g 2  . Multiple Vitamins-Calcium (DAILY VITAMINS FOR WOMEN PO) Take 1 each by mouth daily.    Marland Kitchen amoxicillin-clavulanate (AUGMENTIN) 875-125 MG tablet Take 1 tablet by mouth 2 (two) times daily. 20 tablet 0  . guaiFENesin-codeine (ROBITUSSIN AC) 100-10 MG/5ML syrup Take 5 mLs by mouth 3 (three) times daily as needed for cough. 150 mL 0   No facility-administered medications prior to visit.     Review of Systems  Constitutional: Positive for chills. Negative for diaphoresis, fever, malaise/fatigue and weight loss.  HENT: Positive for congestion, hoarse voice, sinus pressure, sneezing and sore throat. Negative for ear  discharge and ear pain.   Eyes: Negative for blurred vision.  Respiratory: Positive for cough. Negative for sputum production, shortness of breath and wheezing.   Cardiovascular: Negative for chest pain, palpitations and leg swelling.  Gastrointestinal: Negative for abdominal pain, blood in stool, constipation, diarrhea, heartburn, melena and nausea.  Genitourinary: Negative for dysuria, frequency, hematuria and urgency.  Musculoskeletal: Negative for back pain, joint pain, myalgias and neck pain.  Skin: Negative for rash.  Neurological: Positive for headaches. Negative for dizziness, tingling, sensory change and focal weakness.  Endo/Heme/Allergies: Negative for environmental allergies and polydipsia. Does not bruise/bleed easily.  Psychiatric/Behavioral: Negative for depression and suicidal ideas. The patient is not nervous/anxious and does not have insomnia.      Objective  Vitals:   03/30/18 0943  BP: 130/70  Pulse: 76  Weight: 138 lb (62.6 kg)  Height: 5\' 3"  (1.6 m)    Physical Exam  Constitutional: She is oriented to person, place, and time. She appears well-developed and well-nourished.  HENT:  Head: Normocephalic.  Right Ear: External ear normal.  Left Ear: External ear normal.  Nose: Mucosal edema present. Right sinus exhibits maxillary sinus tenderness and frontal sinus tenderness. Left sinus exhibits maxillary sinus tenderness and frontal sinus tenderness.  Mouth/Throat: Oropharynx is clear and moist.  Eyes: Pupils are equal, round, and reactive to light. Conjunctivae and EOM are normal. Lids are everted and swept, no foreign bodies found. Left eye exhibits no hordeolum. No foreign body present in the left eye. Right conjunctiva is not injected. Left conjunctiva is not injected. No scleral icterus.  Neck: Normal range of motion. Neck supple. No JVD present. No tracheal deviation present. No thyromegaly present.  Cardiovascular: Normal rate, regular rhythm, normal heart  sounds and intact distal pulses. Exam reveals no gallop and no friction rub.  No murmur heard. Pulmonary/Chest: Effort normal and breath sounds normal. No respiratory distress. She has no wheezes. She has no rales.  Abdominal: Soft. Bowel sounds are normal. She exhibits no mass. There is no hepatosplenomegaly. There is no tenderness. There is no rebound and no guarding.  Musculoskeletal: Normal range of motion. She exhibits no edema or tenderness.  Lymphadenopathy:    She has no cervical adenopathy.  Neurological: She is alert and oriented to person, place, and time. She has normal strength. She displays normal reflexes. No cranial nerve deficit.  Skin: Skin is warm. No rash noted.  Psychiatric: She has a normal mood and  affect. Her mood appears not anxious. She does not exhibit a depressed mood.  Nursing note and vitals reviewed.     Assessment & Plan  Problem List Items Addressed This Visit    None    Visit Diagnoses    Acute maxillary sinusitis, recurrence not specified    -  Primary   Relevant Medications   amoxicillin-clavulanate (AUGMENTIN) 875-125 MG tablet   guaiFENesin-codeine (ROBITUSSIN AC) 100-10 MG/5ML syrup   Acute maxillary sinusitis, recurrence not specified       Sinustis of bilateral maxillary will treat with augmentin 875 bid for 10 days   Relevant Medications   amoxicillin-clavulanate (AUGMENTIN) 875-125 MG tablet   guaiFENesin-codeine (ROBITUSSIN AC) 100-10 MG/5ML syrup   Bronchitis       Cough unable to sleep at night will cover with robitussin AC 1 tsp qid prn cough.    Relevant Medications   guaiFENesin-codeine (ROBITUSSIN AC) 100-10 MG/5ML syrup      Meds ordered this encounter  Medications  . amoxicillin-clavulanate (AUGMENTIN) 875-125 MG tablet    Sig: Take 1 tablet by mouth 2 (two) times daily.    Dispense:  20 tablet    Refill:  0  . guaiFENesin-codeine (ROBITUSSIN AC) 100-10 MG/5ML syrup    Sig: Take 5 mLs by mouth 3 (three) times daily as  needed for cough.    Dispense:  150 mL    Refill:  0      Dr. Elizabeth Sauereanna Hameed [facility]  03/30/18

## 2018-04-14 ENCOUNTER — Other Ambulatory Visit: Payer: Self-pay | Admitting: Family Medicine

## 2018-04-14 ENCOUNTER — Telehealth: Payer: Self-pay

## 2018-04-14 NOTE — Telephone Encounter (Signed)
Pt called concerning cough that she seems to still have. Explained to her that it may be allergies that is causing the cough. Advised to pick up Zyrtec otc and take with her flonase to see if that helps.

## 2018-04-28 ENCOUNTER — Other Ambulatory Visit: Payer: Self-pay

## 2018-04-28 MED ORDER — BENZONATATE 100 MG PO CAPS: 100.0000 mg | ORAL_CAPSULE | Freq: Two times a day (BID) | ORAL | 0 refills | Status: DC | PRN

## 2018-04-28 NOTE — Progress Notes (Unsigned)
Sent in tessalon perles

## 2018-08-17 ENCOUNTER — Ambulatory Visit: Payer: BLUE CROSS/BLUE SHIELD | Admitting: Family Medicine

## 2018-08-17 ENCOUNTER — Encounter: Payer: Self-pay | Admitting: Family Medicine

## 2018-08-17 VITALS — BP 110/60 | HR 76 | Temp 98.8°F | Ht 63.0 in | Wt 134.0 lb

## 2018-08-17 DIAGNOSIS — G9331 Postviral fatigue syndrome: Secondary | ICD-10-CM

## 2018-08-17 DIAGNOSIS — E860 Dehydration: Secondary | ICD-10-CM

## 2018-08-17 DIAGNOSIS — G933 Postviral fatigue syndrome: Secondary | ICD-10-CM | POA: Diagnosis not present

## 2018-08-17 NOTE — Progress Notes (Signed)
Date:  08/17/2018   Name:  Tracey Hood   DOB:  1990-10-07   MRN:  [id number]   Chief Complaint: Generalized Body Aches (is better than it was. Both kids have the flu- needs to get tested so she can go to work)  Fever   This is a new (patient noted with fever/ ) problem. The current episode started in the past 7 days. The problem occurs intermittently. The problem has been gradually improving. Her temperature was unmeasured prior to arrival. Associated symptoms include muscle aches. Pertinent negatives include no abdominal pain, chest pain, congestion, coughing, diarrhea, ear pain, headaches, nausea, rash, sleepiness, sore throat, urinary pain, vomiting or wheezing. She has tried acetaminophen for the symptoms. The treatment provided moderate relief.  Risk factors: sick contacts   Risk factors: no contaminated food, no contaminated water, no hx of cancer, no immunosuppression, no occupational exposure, no recent sickness and no recent travel     Review of Systems  Constitutional: Positive for fever. Negative for chills, fatigue and unexpected weight change.  HENT: Negative for congestion, ear discharge, ear pain, rhinorrhea, sinus pressure, sneezing and sore throat.   Eyes: Negative for photophobia, pain, discharge, redness and itching.  Respiratory: Negative for cough, shortness of breath, wheezing and stridor.   Cardiovascular: Negative for chest pain.  Gastrointestinal: Negative for abdominal pain, blood in stool, constipation, diarrhea, nausea and vomiting.  Endocrine: Negative for cold intolerance, heat intolerance, polydipsia, polyphagia and polyuria.  Genitourinary: Negative for dysuria, flank pain, frequency, hematuria, menstrual problem, pelvic pain, urgency, vaginal bleeding and vaginal discharge.  Musculoskeletal: Negative for arthralgias, back pain and myalgias.  Skin: Negative for rash.  Allergic/Immunologic: Negative for environmental allergies and food allergies.    Neurological: Negative for dizziness, weakness, light-headedness, numbness and headaches.  Hematological: Negative for adenopathy. Does not bruise/bleed easily.  Psychiatric/Behavioral: Negative for dysphoric mood. The patient is not nervous/anxious.     Patient Active Problem List   Diagnosis Date Noted  . Indication for care in labor and delivery, antepartum 12/13/2014  . Back pain affecting pregnancy 12/11/2014  . Abdominal pain 12/10/2014  . Pregnancy 11/27/2014  . Encounter for suspected PROM, with rupture of membranes not found 11/27/2014  . Indication for care or intervention related to labor and delivery 11/18/2014    No Known Allergies  History reviewed. No pertinent surgical history.  Social History   Tobacco Use  . Smoking status: Never Smoker  . Smokeless tobacco: Never Used  Substance Use Topics  . Alcohol use: No  . Drug use: No     Medication list has been reviewed and updated.  Current Meds  Medication Sig  . fluticasone (FLONASE) 50 MCG/ACT nasal spray Place 2 sprays into both nostrils daily.  . Multiple Vitamins-Calcium (DAILY VITAMINS FOR WOMEN PO) Take 1 each by mouth daily.    PHQ 2/9 Scores 08/05/2017  PHQ - 2 Score 0  PHQ- 9 Score 7    Physical Exam Vitals signs and nursing note reviewed.  Constitutional:      General: She is not in acute distress.    Appearance: She is not diaphoretic.  HENT:     Head: Normocephalic and atraumatic.     Right Ear: External ear normal.     Left Ear: External ear normal.     Nose: Nose normal.  Eyes:     General:        Right eye: No discharge.        Left eye: No discharge.  Conjunctiva/sclera: Conjunctivae normal.     Pupils: Pupils are equal, round, and reactive to light.  Neck:     Musculoskeletal: Normal range of motion and neck supple.     Thyroid: No thyromegaly.     Vascular: No JVD.  Cardiovascular:     Rate and Rhythm: Normal rate and regular rhythm.     Pulses: Normal pulses.      Heart sounds: Normal heart sounds. No murmur. No friction rub. No gallop.   Pulmonary:     Effort: Pulmonary effort is normal.     Breath sounds: Normal breath sounds.  Abdominal:     General: Bowel sounds are normal.     Palpations: Abdomen is soft. There is no mass.     Tenderness: There is no abdominal tenderness. There is no guarding.  Musculoskeletal: Normal range of motion.  Lymphadenopathy:     Cervical: No cervical adenopathy.  Skin:    General: Skin is warm and dry.  Neurological:     Mental Status: She is alert.     Deep Tendon Reflexes: Reflexes are normal and symmetric.     BP 110/60   Pulse 76   Temp 98.8 F (37.1 C) (Oral)   Ht 5\' 3"  (1.6 m)   Wt 134 lb (60.8 kg)   BMI 23.74 kg/m   Assessment and Plan: 1. Post viral syndrome Patient was seen following a viral-like illness with elevated temperature and myalgias/malaise/fatigue.  And is improving but is still having some myalgias for which Tylenol/ibuprofen has been suggested.  Patient is no longer considered contagious may return to work tomorrow  2. Dehydration Patient has decreased moisture to the mucous membranes consistent with dehydration and hydration was discussed with patient.

## 2018-08-25 ENCOUNTER — Other Ambulatory Visit: Payer: Self-pay

## 2018-08-25 DIAGNOSIS — R059 Cough, unspecified: Secondary | ICD-10-CM

## 2018-08-25 DIAGNOSIS — R05 Cough: Secondary | ICD-10-CM

## 2018-08-25 MED ORDER — BENZONATATE 100 MG PO CAPS: 100.0000 mg | ORAL_CAPSULE | Freq: Two times a day (BID) | ORAL | 0 refills | Status: DC | PRN

## 2018-08-25 NOTE — Progress Notes (Unsigned)
Called in stating has cough. Sent in tessalon perles to [facility]

## 2019-02-28 ENCOUNTER — Encounter: Payer: Self-pay | Admitting: Emergency Medicine

## 2019-02-28 ENCOUNTER — Emergency Department
Admission: EM | Admit: 2019-02-28 | Discharge: 2019-02-28 | Disposition: A | Discharge status: Home / Self Care | Payer: BLUE CROSS/BLUE SHIELD | Attending: Emergency Medicine | Admitting: Emergency Medicine

## 2019-02-28 ENCOUNTER — Other Ambulatory Visit: Payer: Self-pay

## 2019-02-28 ENCOUNTER — Emergency Department: Payer: BLUE CROSS/BLUE SHIELD

## 2019-02-28 DIAGNOSIS — S5001XA Contusion of right elbow, initial encounter: Secondary | ICD-10-CM | POA: Insufficient documentation

## 2019-02-28 DIAGNOSIS — Y999 Unspecified external cause status: Secondary | ICD-10-CM | POA: Diagnosis not present

## 2019-02-28 DIAGNOSIS — S51011A Laceration without foreign body of right elbow, initial encounter: Secondary | ICD-10-CM | POA: Insufficient documentation

## 2019-02-28 DIAGNOSIS — Z79899 Other long term (current) drug therapy: Secondary | ICD-10-CM | POA: Insufficient documentation

## 2019-02-28 DIAGNOSIS — Y9389 Activity, other specified: Secondary | ICD-10-CM | POA: Diagnosis not present

## 2019-02-28 DIAGNOSIS — S0990XA Unspecified injury of head, initial encounter: Secondary | ICD-10-CM | POA: Diagnosis not present

## 2019-02-28 DIAGNOSIS — Y92008 Other place in unspecified non-institutional (private) residence as the place of occurrence of the external cause: Secondary | ICD-10-CM | POA: Diagnosis not present

## 2019-02-28 DIAGNOSIS — W010XXA Fall on same level from slipping, tripping and stumbling without subsequent striking against object, initial encounter: Secondary | ICD-10-CM | POA: Diagnosis not present

## 2019-02-28 DIAGNOSIS — W19XXXA Unspecified fall, initial encounter: Secondary | ICD-10-CM

## 2019-02-28 DIAGNOSIS — S8991XA Unspecified injury of right lower leg, initial encounter: Secondary | ICD-10-CM | POA: Diagnosis present

## 2019-02-28 DIAGNOSIS — S80212A Abrasion, left knee, initial encounter: Secondary | ICD-10-CM | POA: Insufficient documentation

## 2019-02-28 DIAGNOSIS — S80211A Abrasion, right knee, initial encounter: Secondary | ICD-10-CM | POA: Diagnosis not present

## 2019-02-28 MED ORDER — LIDOCAINE HCL (PF) 1 % IJ SOLN
5.0000 mL | Freq: Once | INTRAMUSCULAR | Status: AC
  Administered 2019-02-28: 5 mL

## 2019-02-28 MED ORDER — IBUPROFEN 600 MG PO TABS
600.0000 mg | ORAL_TABLET | Freq: Once | ORAL | Status: AC
  Administered 2019-02-28: 600 mg via ORAL
  Filled 2019-02-28: qty 1

## 2019-02-28 NOTE — ED Triage Notes (Addendum)
Patient ambulatory to triage with steady gait, without difficulty or distress noted, mask in place; pt reports that she fell last night injuring rt arm; approx 1" lac just below elbow with no active bleeding; gauze dressing applied; pt c/o arm pain but unable to localize pain

## 2019-02-28 NOTE — ED Provider Notes (Signed)
[facility] Emergency Department Provider Note  ____________________________________________  Time seen: Approximately 6:29 AM  I have reviewed the triage vital signs and the nursing notes.   HISTORY  Chief Complaint Arm Injury   HPI Samanthamarie Canary BrimRena Polka is a 28 y.o. female with no significant past medical history who presents for evaluation after mechanical fall.  Patient reports that she was going to her mother's house last night carrying several boxes of pizza when she tripped and fell on the driveway.  She fell onto her knees and on her right arm.  She hit her head on the floor but did not pass out.  She felt dizzy afterwards.  She reports that the fall was mechanical in nature.  She denies any headache or amnesia, no neck pain or back pain.  She is complaining of pain on bilateral knees and the right arm.  She also has a small laceration on her right elbow.  Tetanus shot is up-to-date.   History reviewed. No pertinent past medical history.  Patient Active Problem List   Diagnosis Date Noted  . Indication for care in labor and delivery, antepartum 12/13/2014  . Back pain affecting pregnancy 12/11/2014  . Abdominal pain 12/10/2014  . Pregnancy 11/27/2014  . Encounter for suspected PROM, with rupture of membranes not found 11/27/2014  . Indication for care or intervention related to labor and delivery 11/18/2014    History reviewed. No pertinent surgical history.  Prior to Admission medications   Medication Sig Start Date End Date Taking? Authorizing Provider  benzonatate (TESSALON) 100 MG capsule Take 1 capsule (100 mg total) by mouth 2 (two) times daily as needed for cough. 08/25/18   Duanne LimerickJones, Deanna C, MD  fluticasone (FLONASE) 50 MCG/ACT nasal spray Place 2 sprays into both nostrils daily. 07/21/17   Duanne LimerickJones, Deanna C, MD  Multiple Vitamins-Calcium (DAILY VITAMINS FOR WOMEN PO) Take 1 each by mouth daily.    [provider]    Allergies Patient  has no known allergies.  Family History  Problem Relation Age of Onset  . Hypertension Father   . Alcohol abuse Neg Hx   . Arthritis Neg Hx   . Birth defects Neg Hx   . Cancer Neg Hx   . Drug abuse Neg Hx   . Asthma Neg Hx   . Diabetes Neg Hx   . COPD Neg Hx   . Depression Neg Hx   . Early death Neg Hx   . Hearing loss Neg Hx   . Heart disease Neg Hx   . Hyperlipidemia Neg Hx   . Kidney disease Neg Hx   . Learning disabilities Neg Hx   . Mental illness Neg Hx   . Mental retardation Neg Hx   . Miscarriages / Stillbirths Neg Hx   . Stroke Neg Hx   . Vision loss Neg Hx   . Varicose Veins Neg Hx     Social History Social History   Tobacco Use  . Smoking status: Never Smoker  . Smokeless tobacco: Never Used  Substance Use Topics  . Alcohol use: No  . Drug use: No    Review of Systems Constitutional: Negative for fever. Eyes: Negative for visual changes. ENT: Negative for facial injury or neck injury Cardiovascular: Negative for chest injury. Respiratory: Negative for shortness of breath. Negative for chest wall injury. Gastrointestinal: Negative for abdominal pain or injury. Genitourinary: Negative for dysuria. Musculoskeletal: Negative for back injury, + b/l knee abrasions, R arm pain Skin: +  R elbow laceration  Neurological:+ head injury.   ____________________________________________   PHYSICAL EXAM:  VITAL SIGNS: ED Triage Vitals  Enc Vitals Group     BP 02/28/19 0607 (!) 117/52     Pulse Rate 02/28/19 0607 65     Resp 02/28/19 0607 16     Temp 02/28/19 0607 98.2 F (36.8 C)     Temp Source 02/28/19 0607 Oral     SpO2 02/28/19 0607 100 %     Weight 02/28/19 0604 125 lb (56.7 kg)     Height 02/28/19 0604 5\' 3"  (1.6 m)     Head Circumference --      Peak Flow --      Pain Score 02/28/19 0622 6     Pain Loc --      Pain Edu? --      Excl. in [place]? --     Constitutional: Alert and oriented. No acute distress. Does not appear intoxicated. HEENT  Head: Normocephalic and atraumatic. Face: No facial bony tenderness. Stable midface Ears: No hemotympanum bilaterally. No Battle sign Eyes: No eye injury. PERRL. No raccoon eyes Nose: Nontender. No epistaxis. No rhinorrhea Mouth/Throat: Mucous membranes are moist. No oropharyngeal blood. No dental injury. Airway patent without stridor. Normal voice. Neck: no C-collar. No midline c-spine tenderness.  Cardiovascular: Normal rate, regular rhythm. Normal and symmetric distal pulses are present in all extremities. Pulmonary/Chest: Chest wall is stable and nontender to palpation/compression. Normal respiratory effort. Breath sounds are normal. No crepitus.  Abdominal: Soft, nontender, non distended. Musculoskeletal: Abrasions on b/l knees. Nontender with normal full range of motion in all extremities. No deformities. No thoracic or lumbar midline spinal tenderness. Pelvis is stable. Skin: Skin is warm, dry and intact. 2.5cm R elbow laceration Psychiatric: Speech and behavior are appropriate. Neurological: Normal speech and language. Moves all extremities to command. No gross focal neurologic deficits are appreciated.  Glascow Coma Score: 4 - Opens eyes on own 6 - Follows simple motor commands 5 - Alert and oriented GCS: 15  ____________________________________________   LABS (all labs ordered are listed, but only abnormal results are displayed)  Labs Reviewed - No data to display ____________________________________________  EKG  none  ____________________________________________  RADIOLOGY  I have personally reviewed the images performed during this visit and I agree with the Radiologist's read.   Interpretation by Radiologist:  Dg Elbow Complete Right (3+view)  Result Date: 02/28/2019 CLINICAL DATA:  Fall with right elbow pain. EXAM: RIGHT ELBOW - COMPLETE 3+ VIEW COMPARISON:  None. FINDINGS: Soft tissue swelling to the dorsal proximal forearm. No fracture, malalignment, or  joint effusion. IMPRESSION: Soft tissue injury to the proximal forearm without osseous abnormality or opaque foreign body. Electronically Signed   By: Monte Fantasia M.D.   On: 02/28/2019 07:12   Dg Knee Complete 4 Views Left  Result Date: 02/28/2019 CLINICAL DATA:  Bilateral knee pain after fall EXAM: LEFT KNEE - COMPLETE 4+ VIEW COMPARISON:  None. FINDINGS: Infrapatellar soft tissue swelling without opaque foreign body. Negative for fracture or malalignment. IMPRESSION: Soft tissue swelling without fracture or opaque foreign body. Electronically Signed   By: Monte Fantasia M.D.   On: 02/28/2019 07:16   Dg Knee Complete 4 Views Right  Result Date: 02/28/2019 CLINICAL DATA:  Fall fall with knee pain EXAM: RIGHT KNEE - COMPLETE 4+ VIEW COMPARISON:  None. FINDINGS: Mild infrapatellar soft tissue swelling. No evidence of fracture, dislocation, or joint effusion. IMPRESSION: Mild soft tissue swelling without osseous abnormality. Electronically Signed  By: Marnee SpringJonathon  Watts M.D.   On: 02/28/2019 07:16      ____________________________________________   PROCEDURES  Procedure(s) performed:yes .Marland Kitchen.Laceration Repair  Date/Time: 02/28/2019 6:33 AM Performed by: Nita SickleVeronese, [place], MD Authorized by: Nita SickleVeronese, [place], MD   Consent:    Consent obtained:  Verbal   Consent given by:  Patient   Risks discussed:  Infection, pain, retained foreign body, poor cosmetic result and poor wound healing Anesthesia (see MAR for exact dosages):    Anesthesia method:  Local infiltration   Local anesthetic:  Lidocaine 1% w/o epi Laceration details:    Location:  Shoulder/arm   Shoulder/arm location:  R elbow   Length (cm):  2.5 Repair type:    Repair type:  Simple Pre-procedure details:    Preparation:  Patient was prepped and draped in usual sterile fashion Exploration:    Hemostasis achieved with:  Direct pressure   Wound exploration: entire depth of wound probed and visualized     Wound extent: no  fascia violation noted, no foreign bodies/material noted, no tendon damage noted and no underlying fracture noted     Contaminated: no   Treatment:    Area cleansed with:  Saline   Amount of cleaning:  Extensive   Irrigation solution:  Sterile saline   Visualized foreign bodies/material removed: no   Skin repair:    Repair method:  Sutures   Suture size:  4-0   Suture material:  Nylon   Suture technique:  Simple interrupted   Number of sutures:  2 Approximation:    Approximation:  Close Post-procedure details:    Dressing:  Sterile dressing   Patient tolerance of procedure:  Tolerated well, no immediate complications   Critical Care performed:  None ____________________________________________   INITIAL IMPRESSION / ASSESSMENT AND PLAN / ED COURSE  28 y.o. female with no significant past medical history who presents for evaluation after mechanical fall.  Patient with bilateral knee abrasions with negative x-rays.  Right elbow pain with a laceration and negative x-ray.  Laceration was repaired per procedure note above.  Tetanus shot is up-to-date.  Patient reports hitting her head however per [place] rule no indication for head CT.  No CT and L-spine tenderness.  Discussed wound care with patient and follow-up with PCP for stitch removal.  Discussed my standard return precautions.       As part of my medical decision making, I reviewed the following data within the electronic MEDICAL RECORD NUMBER Nursing notes reviewed and incorporated, Old chart reviewed, Radiograph reviewed , Notes from prior ED visits and [place] Controlled Substance Database   Patient was evaluated in Emergency Department today for the symptoms described in the history of present illness. Patient was evaluated in the context of the global COVID-19 pandemic, which necessitated consideration that the patient might be at risk for infection with the SARS-CoV-2 virus that causes COVID-19. Institutional protocols and  algorithms that pertain to the evaluation of patients at risk for COVID-19 are in a state of rapid change based on information released by regulatory bodies including the CDC and federal and state organizations. These policies and algorithms were followed during the patient's care in the ED.   ____________________________________________   FINAL CLINICAL IMPRESSION(S) / ED DIAGNOSES   Final diagnoses:  Abrasion of knee, bilateral  Fall, initial encounter  Laceration of right elbow, initial encounter  Contusion of right elbow, initial encounter      NEW MEDICATIONS STARTED DURING THIS VISIT:  ED Discharge Orders    None  Note:  This document was prepared using Dragon voice recognition software and may include unintentional dictation errors.    Don PerkingVeronese, [place], MD 02/28/19 2312

## 2019-02-28 NOTE — ED Notes (Signed)
Signature pad in room not working- printed discharge and had pt sign 

## 2019-02-28 NOTE — ED Notes (Signed)
Report given to Ariel, RN

## 2019-02-28 NOTE — Discharge Instructions (Addendum)
Keep laceration dry and clean. Wash with warm water and soap. Apply topical bacitracin. Protect from the sun to minimize scarring. Cover it with SPF 44 or higher when out in the sun for 6-9 months. You received 2 stithces that must be removed in 7 days.  Watch for signs of infection: pus, redness of the skin surrounding it, or fever. If these develop see your doctor or return to the ER for antibiotics.

## 2019-10-04 ENCOUNTER — Ambulatory Visit
Admission: EM | Admit: 2019-10-04 | Discharge: 2019-10-04 | Disposition: A | Discharge status: Home / Self Care | Payer: BLUE CROSS/BLUE SHIELD | Attending: Emergency Medicine | Admitting: Emergency Medicine

## 2019-10-04 ENCOUNTER — Encounter: Payer: Self-pay | Admitting: Emergency Medicine

## 2019-10-04 ENCOUNTER — Telehealth: Payer: Self-pay

## 2019-10-04 ENCOUNTER — Other Ambulatory Visit: Payer: Self-pay

## 2019-10-04 DIAGNOSIS — S90221A Contusion of right lesser toe(s) with damage to nail, initial encounter: Secondary | ICD-10-CM | POA: Diagnosis not present

## 2019-10-04 DIAGNOSIS — Y998 Other external cause status: Secondary | ICD-10-CM | POA: Diagnosis not present

## 2019-10-04 MED ORDER — IBUPROFEN 400 MG PO TABS: 400.0000 mg | ORAL_TABLET | Freq: Four times a day (QID) | ORAL | 0 refills | Status: DC | PRN

## 2019-10-04 NOTE — Discharge Instructions (Addendum)
ibuprofen 400 mg combined with 1000 mg of Tylenol 3-4 times a day as needed for pain, ice versus warm soaks, whatever feels better, discontinue the bunion pad/toe spacer, wear wide toed shoes only for at least the next week.

## 2019-10-04 NOTE — ED Triage Notes (Signed)
Patient c/o right big toe pain that started 1 week ago. She denies injury to the area.

## 2019-10-04 NOTE — ED Provider Notes (Signed)
HPI  SUBJECTIVE:  Tracey Hood is a 29 y.o. female who presents with 1 week of right medial first toenail pain.  Describes it as a constant soreness.  States she feels as if the "nail is coming off".  She reports pinkish/ purplish discoloration of the nail today.  She has been wearing a rubber spacer between her first and second toes to help correct her bunion, states that it does not touch the nail.  She wears high heels and other narrow shoes.  She does not run.  She denies other trauma to the foot.  She has not cut her nails differently.  She does not get pedicures.  No swelling, erythema of the toe.  No fevers, body aches.  No discoloration of the toe itself.  She has an appointment with podiatry next Wednesday 3/24. no drainage.  She tried warm water soaks and elevating it without improvement in her symptoms.  Symptoms are worse with palpation.  She has never had symptoms like this before.  Past medical history negative for diabetes neuropathy peripheral arterial disease peripheral vascular disease, anti-coagulant antiplatelet use.  LMP: Amenorrheic due to IUD.  Denies the possibility of being pregnant.  PMD: Dr. Otilio Miu.  Podiatry: Dr. Samara Deist.    History reviewed. No pertinent past medical history.  History reviewed. No pertinent surgical history.  Family History  Problem Relation Age of Onset  . Hypertension Father   . Alcohol abuse Neg Hx   . Arthritis Neg Hx   . Birth defects Neg Hx   . Cancer Neg Hx   . Drug abuse Neg Hx   . Asthma Neg Hx   . Diabetes Neg Hx   . COPD Neg Hx   . Depression Neg Hx   . Early death Neg Hx   . Hearing loss Neg Hx   . Heart disease Neg Hx   . Hyperlipidemia Neg Hx   . Kidney disease Neg Hx   . Learning disabilities Neg Hx   . Mental illness Neg Hx   . Mental retardation Neg Hx   . Miscarriages / Stillbirths Neg Hx   . Stroke Neg Hx   . Vision loss Neg Hx   . Varicose Veins Neg Hx     Social History   Tobacco Use  .  Smoking status: Never Smoker  . Smokeless tobacco: Never Used  Substance Use Topics  . Alcohol use: No  . Drug use: No    No current facility-administered medications for this encounter.  Current Outpatient Medications:  .  ibuprofen (ADVIL) 400 MG tablet, Take 1 tablet (400 mg total) by mouth every 6 (six) hours as needed., Disp: 30 tablet, Rfl: 0  No Known Allergies   ROS  As noted in HPI.   Physical Exam  BP 112/65 (BP Location: Right Arm)   Pulse 72   Temp 98.4 F (36.9 C) (Oral)   Resp 18   Ht 5\' 3"  (1.6 m)   Wt 55.8 kg   SpO2 100%   BMI 21.79 kg/m   Constitutional: Well developed, well nourished, no acute distress Eyes:  EOMI, conjunctiva normal bilaterally HENT: Normocephalic, atraumatic,mucus membranes moist Respiratory: Normal inspiratory effort Cardiovascular: Normal rate GI: nondistended skin: No rash, skin intact Musculoskeletal: No erythema, swelling of the skin surrounding the nailbed.  No expressible purulent drainage.  Positive mild purplish discoloration medial aspect of the toenail.  It is approximately 25% of toenail.  Cap refill less than 2 seconds.  Toe nontender.  Sensation intact.      Neurologic: Alert & oriented x 3, no focal neuro deficits Psychiatric: Speech and behavior appropriate   ED Course   Medications - No data to display  No orders of the defined types were placed in this encounter.   No results found for this or any previous visit (from the past 24 hour(s)). No results found.  ED Clinical Impression  1. Subungual hematoma of toe, right, initial encounter      ED Assessment/Plan  Appears to be a mild subungual hematoma, perhaps from tight shoes or the rubber pad that she is wearing to correct bunion.  There does not appear to be ingrown toenail, cellulitis, paronychia.  Toe is nontender.  Do not think that imaging is necessary today.  She has follow-up with podiatry already next Wed at 8:30 in the morning.  We will  send her home with ibuprofen 400 mg combined with 1000 mg of Tylenol 3-4 times a day as needed for pain, continue ice versus warm soaks, whatever feels better, discontinue the bunion pad/toe spacer, recommended wide toed shoes.  Discussed medical decision-making, treatment plan and plan for follow-up with patient.  She agrees with plan.  Meds ordered this encounter  Medications  . ibuprofen (ADVIL) 400 MG tablet    Sig: Take 1 tablet (400 mg total) by mouth every 6 (six) hours as needed.    Dispense:  30 tablet    Refill:  0    *This clinic note was created using Scientist, clinical (histocompatibility and immunogenetics). Therefore, there may be occasional mistakes despite careful proofreading.   ?    Domenick Gong, MD 10/04/19 1143

## 2019-10-04 NOTE — Telephone Encounter (Signed)
Pt called and c/o toenail hurting and appearing to be pink in the area where it hurts. She does not recall stumping it or dropping anything on the foot. She said it is not an ingrown toenail. Therefore, I advised her to call podiatry and see if they can take a look at it. I gave her the number to [facility] podiatry

## 2019-12-17 ENCOUNTER — Ambulatory Visit
Admission: EM | Admit: 2019-12-17 | Discharge: 2019-12-17 | Disposition: A | Discharge status: Home / Self Care | Payer: BLUE CROSS/BLUE SHIELD | Attending: Family Medicine | Admitting: Family Medicine

## 2019-12-17 ENCOUNTER — Other Ambulatory Visit: Payer: Self-pay

## 2019-12-17 DIAGNOSIS — L299 Pruritus, unspecified: Secondary | ICD-10-CM

## 2019-12-17 DIAGNOSIS — L923 Foreign body granuloma of the skin and subcutaneous tissue: Secondary | ICD-10-CM | POA: Diagnosis not present

## 2019-12-17 NOTE — Discharge Instructions (Signed)
Over the counter antihistamines and cortisone cream

## 2019-12-17 NOTE — ED Triage Notes (Signed)
Patient states that she was sanding her door down last night and she started to noticed fiberglass irritating her skin. Patient states that she has taken 2 showers but feels like the fiberglass is still on her skin and is poking her.

## 2019-12-17 NOTE — ED Provider Notes (Signed)
[name]    CSN: [id number] Arrival date & time: 12/17/19  1013      History   Chief Complaint Chief Complaint  Patient presents with  . Skin Problem    HPI Tracey Hood is a 29 y.o. female.   29 yo female with a c/o itching all over after sanding a fiberglass door yesterday. States she took 2 showers yesterday to wash it off but still "feel like the fiberglass is on my skin". Denies any swelling, hives, shortness of breath.      History reviewed. No pertinent past medical history.  Patient Active Problem List   Diagnosis Date Noted  . Indication for care in labor and delivery, antepartum 12/13/2014  . Back pain affecting pregnancy 12/11/2014  . Abdominal pain 12/10/2014  . Pregnancy 11/27/2014  . Encounter for suspected PROM, with rupture of membranes not found 11/27/2014  . Indication for care or intervention related to labor and delivery 11/18/2014    History reviewed. No pertinent surgical history.  OB History    Gravida  2   Para  2   Term  2   Preterm  0   AB  0   Living  1     SAB  0   TAB  0   Ectopic  0   Multiple  0   Live Births  1            Home Medications    Prior to Admission medications   Medication Sig Start Date End Date Taking? Authorizing Provider  ibuprofen (ADVIL) 400 MG tablet Take 1 tablet (400 mg total) by mouth every 6 (six) hours as needed. 10/04/19   Melynda Ripple, MD  fluticasone (FLONASE) 50 MCG/ACT nasal spray Place 2 sprays into both nostrils daily. 07/21/17 10/04/19  Juline Patch, MD    Family History Family History  Problem Relation Age of Onset  . Hypertension Father   . Alcohol abuse Neg Hx   . Arthritis Neg Hx   . Birth defects Neg Hx   . Cancer Neg Hx   . Drug abuse Neg Hx   . Asthma Neg Hx   . Diabetes Neg Hx   . COPD Neg Hx   . Depression Neg Hx   . Early death Neg Hx   . Hearing loss Neg Hx   . Heart disease Neg Hx   . Hyperlipidemia Neg Hx   . Kidney disease  Neg Hx   . Learning disabilities Neg Hx   . Mental illness Neg Hx   . Mental retardation Neg Hx   . Miscarriages / Stillbirths Neg Hx   . Stroke Neg Hx   . Vision loss Neg Hx   . Varicose Veins Neg Hx     Social History Social History   Tobacco Use  . Smoking status: Never Smoker  . Smokeless tobacco: Never Used  Substance Use Topics  . Alcohol use: No  . Drug use: No     Allergies   Patient has no known allergies.   Review of Systems Review of Systems   Physical Exam Triage Vital Signs ED Triage Vitals  Enc Vitals Group     BP 12/17/19 1034 110/80     Pulse Rate 12/17/19 1034 84     Resp 12/17/19 1034 17     Temp 12/17/19 1034 98.6 F (37 C)     Temp Source 12/17/19 1034 Oral     SpO2 12/17/19 1034 100 %  Weight 12/17/19 1033 123 lb (55.8 kg)     Height 12/17/19 1033 5\' 4"  (1.626 m)     Head Circumference --      Peak Flow --      Pain Score 12/17/19 1032 7     Pain Loc --      Pain Edu? --      Excl. in GC? --    No data found.  Updated Vital Signs BP 110/80 (BP Location: Right Arm)   Pulse 84   Temp 98.6 F (37 C) (Oral)   Resp 17   Ht 5\' 4"  (1.626 m)   Wt 55.8 kg   SpO2 100%   BMI 21.11 kg/m   Visual Acuity Right Eye Distance:   Left Eye Distance:   Bilateral Distance:    Right Eye Near:   Left Eye Near:    Bilateral Near:     Physical Exam Vitals and nursing note reviewed.  Constitutional:      General: She is not in acute distress.    Appearance: Normal appearance. She is not ill-appearing, toxic-appearing or diaphoretic.  Cardiovascular:     Rate and Rhythm: Normal rate.  Pulmonary:     Effort: Pulmonary effort is normal. No respiratory distress.     Breath sounds: Normal breath sounds. No wheezing.  Musculoskeletal:     Cervical back: Neck supple.  Skin:    Findings: No rash.  Neurological:     Mental Status: She is alert.      UC Treatments / Results  Labs (all labs ordered are listed, but only abnormal results  are displayed) Labs Reviewed - No data to display  EKG   Radiology No results found.  Procedures Procedures (including critical care time)  Medications Ordered in UC Medications - No data to display  Initial Impression / Assessment and Plan / UC Course  I have reviewed the triage vital signs and the nursing notes.  Pertinent labs & imaging results that were available during my care of the patient were reviewed by me and considered in my medical decision making (see chart for details).      Final Clinical Impressions(s) / UC Diagnoses   Final diagnoses:  Foreign body reaction of the skin  Itching     Discharge Instructions     Over the counter antihistamines and cortisone cream    ED Prescriptions    None      1. diagnosis reviewed with patient 2. Recommend supportive treatment with otc antihistamines, cortsisone prn 3. Follow-up prn if symptoms worsen or don't improve PDMP not reviewed this encounter.   12/19/19, MD 12/17/19 1241

## 2020-02-26 IMAGING — DX RIGHT ELBOW - COMPLETE 3+ VIEW
4 series · 4 of 4 positions shown · non-contrast
Comparison: None.

CLINICAL DATA: Fall with right elbow pain.

EXAM:
RIGHT ELBOW - COMPLETE 3+ VIEW

[elbow ap]
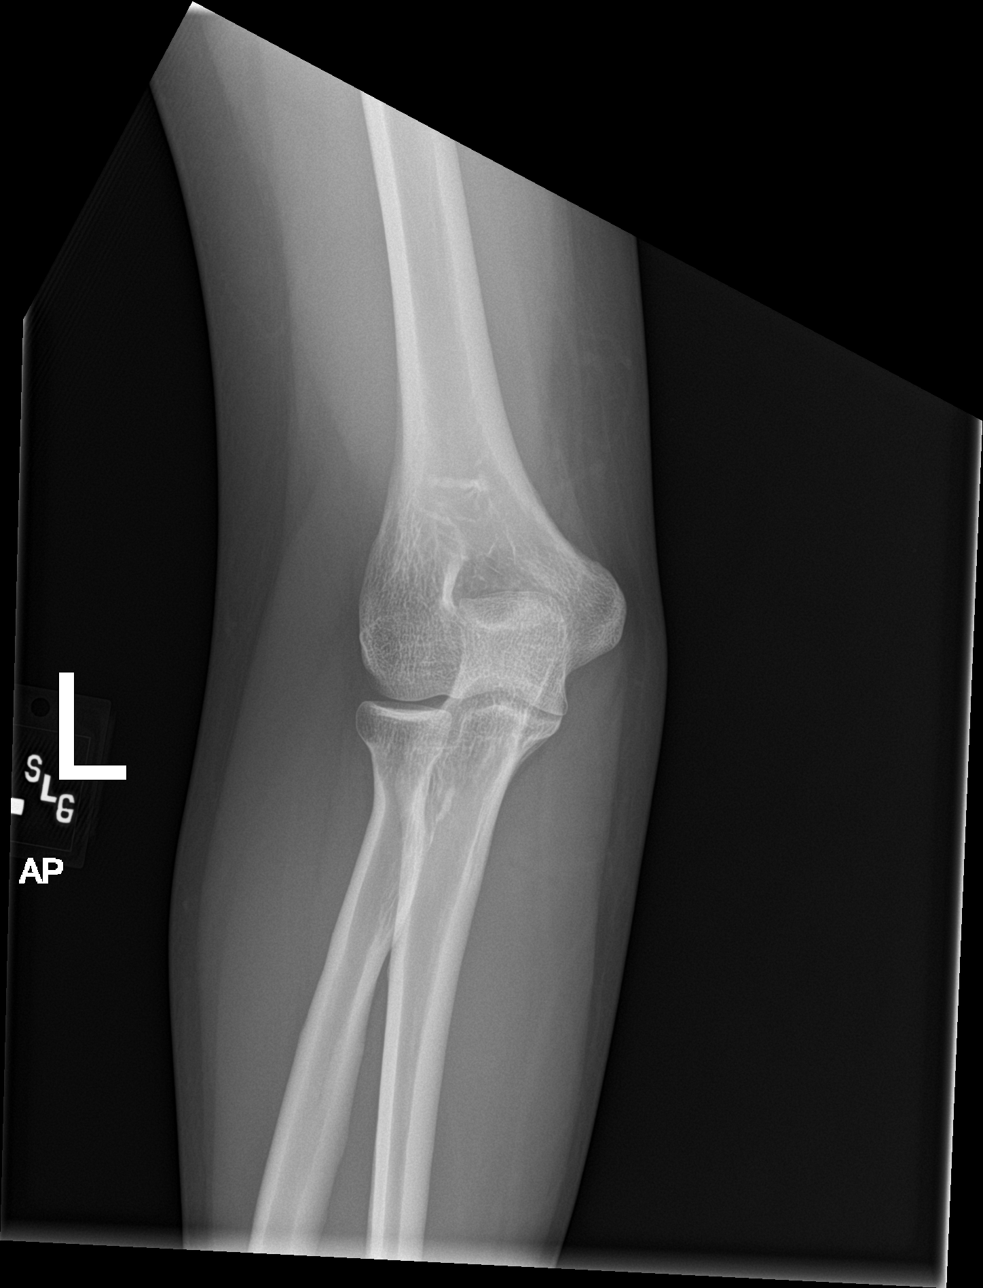

[elbow obl (1 of 2)]
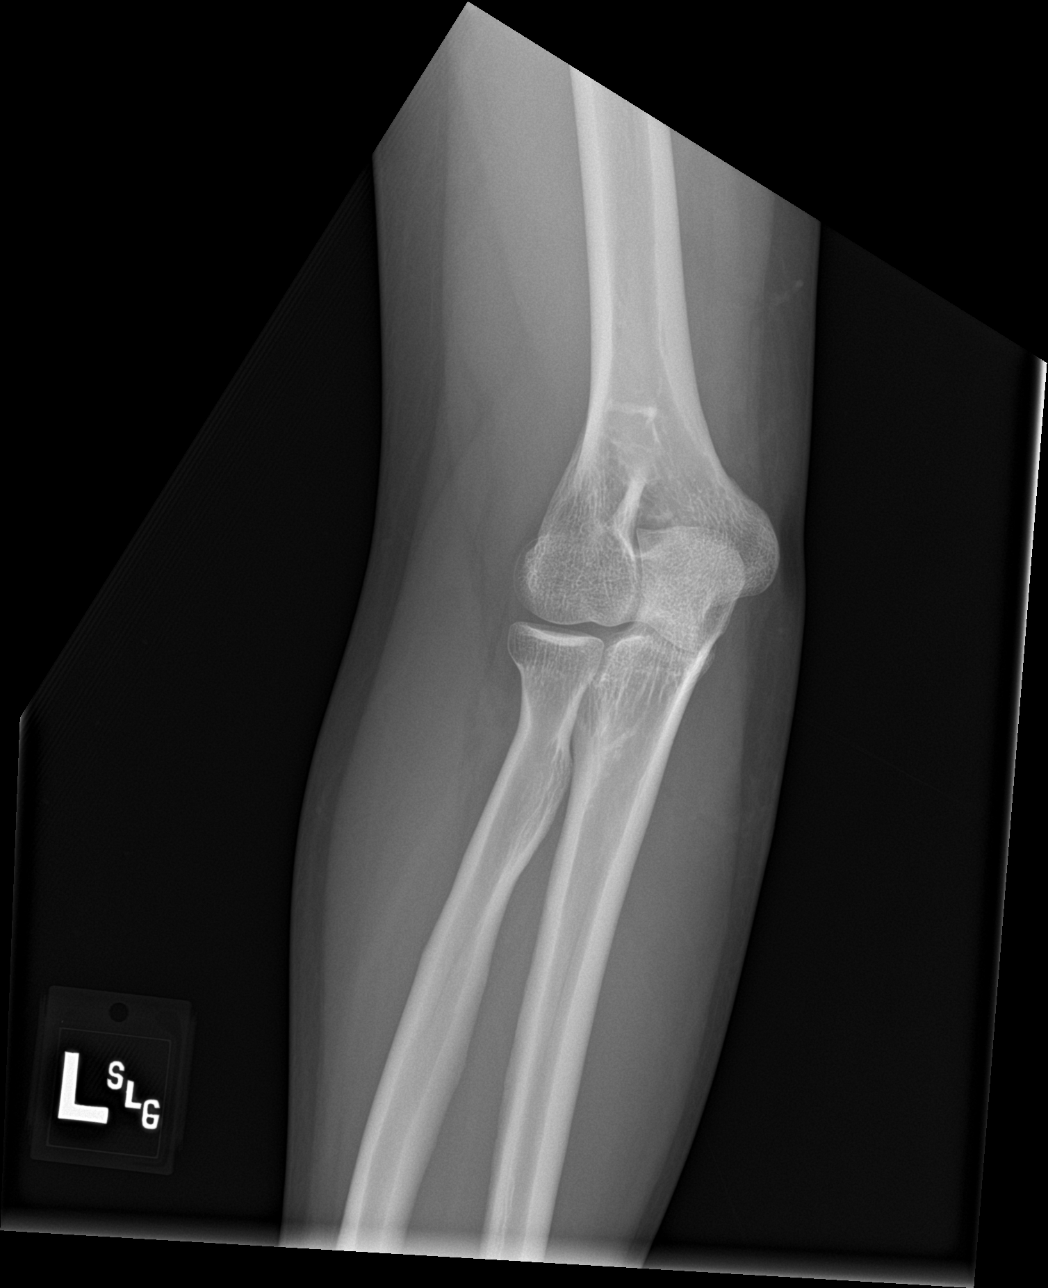

[elbow obl (2 of 2)]
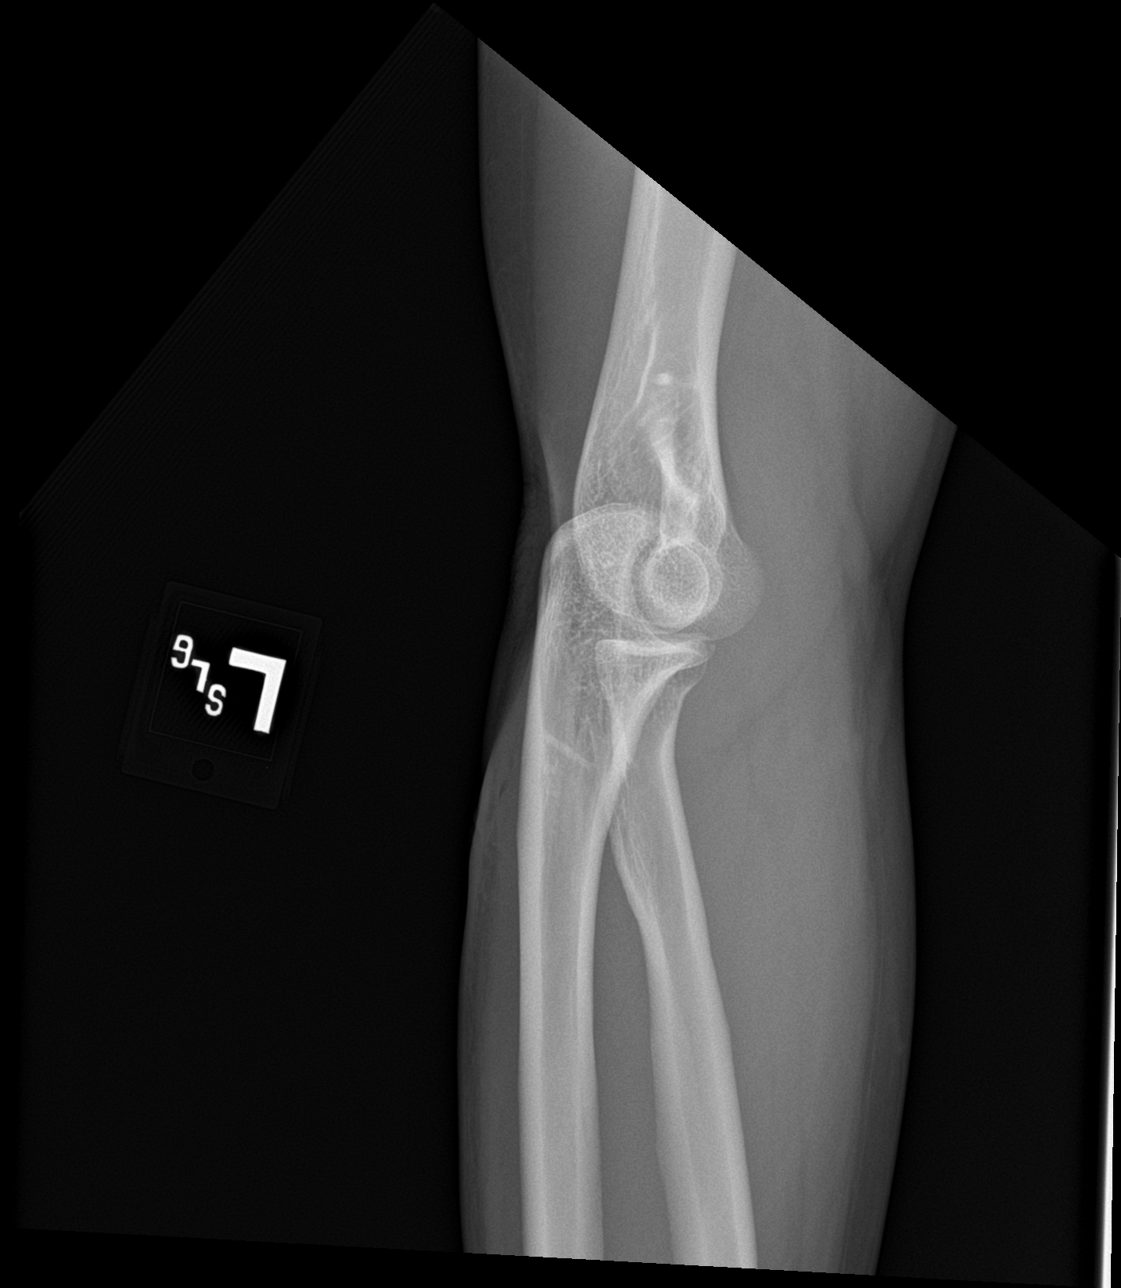

[elbow lat]
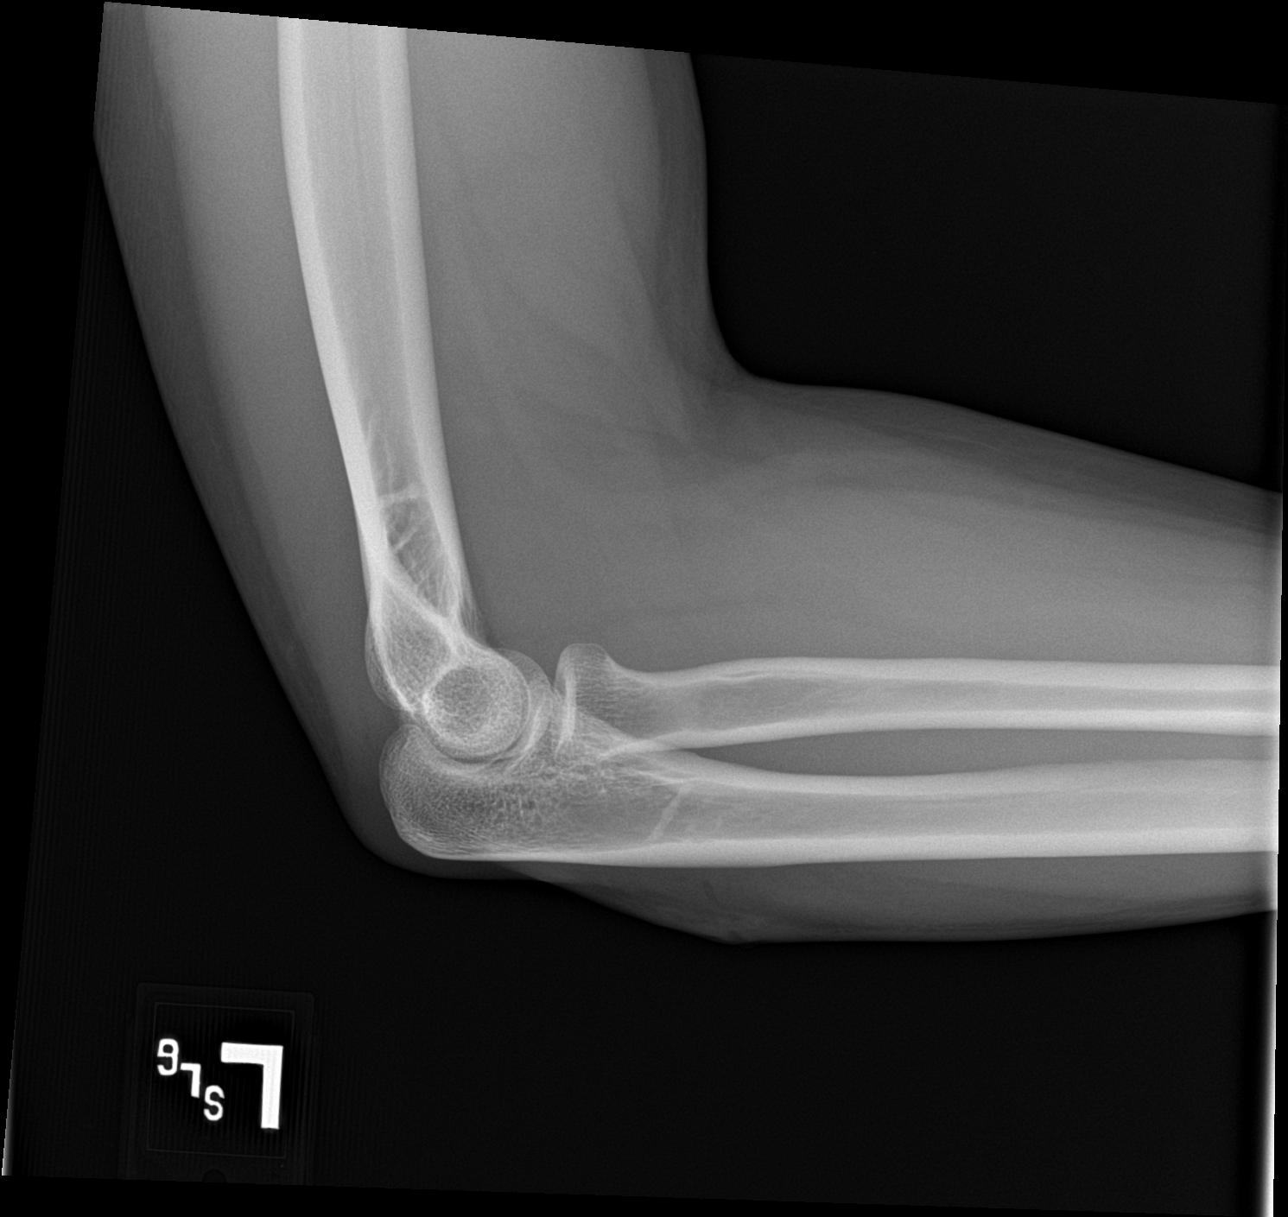

[4 of 4 positions shown; findings below may reference images not displayed]

FINDINGS: Soft tissue swelling to the dorsal proximal forearm. No fracture,
malalignment, or joint effusion.
IMPRESSION: Soft tissue injury to the proximal forearm without osseous
abnormality or opaque foreign body.

## 2020-03-27 ENCOUNTER — Ambulatory Visit: Payer: Self-pay

## 2020-03-27 NOTE — Telephone Encounter (Signed)
Follow protocol for covid even though she has tested negative right now.

## 2020-03-27 NOTE — Telephone Encounter (Signed)
Called patient and she states that she started to have COVID-19 symptoms September 6th  She went and was tested negative on the 7th.  She states that her symptoms have progressed.  She has fever as high as 104, cough,headache, sore throat nasal congestion and loss of taste.  She has had 1st dose of COVID-19 vaccine and is due for 2nd September 17.  She states she was exposed to [employer] who's children were all home with positive COVID-19. She states that with the exception of fever which she states has not returned today.  She feels her symptoms are the same as yesterday. Today she feels dizzy when she moves her head side to side.  She is able to walk. She states she is drinking extra fluids. She was advised by her employer that she would need to retest at 5-7 days from onset of symptoms and she plans to retest. Care advice was read to patient.  She was told to treat symptoms with any OTC medications that help. If she develops breathing issues please seek medical care. She was also advised that she should be fully recovered and have completed quarantine before having her 2nd dose of COVID-19 vaccine.  She verbalized understanding. No virtual appointment with provider was found. Called office no answer. Will route note to office for follow up.  Reason for Disposition . [1] HIGH RISK patient AND [2] influenza is widespread in the community AND [3] ONE OR MORE respiratory symptoms: cough, sore throat, runny or stuffy nose  Answer Assessment - Initial Assessment Questions 1. COVID-19 DIAGNOSIS: "Who made your Coronavirus (COVID-19) diagnosis?" "Was it confirmed by a positive lab test?" If not diagnosed by a HCP, ask "Are there lots of cases (community spread) where you live?" (See public health department website, if unsure)     yes 2. COVID-19 EXPOSURE: "Was there any known exposure to COVID before the symptoms began?" CDC Definition of close contact: within 6 feet (2 meters) for a total of 15 minutes or more  over a 24-hour period.      Manager at work 3. ONSET: "When did the COVID-19 symptoms start?"      Monday 4. WORST SYMPTOM: "What is your worst symptom?" (e.g., cough, fever, shortness of breath, muscle aches)    Headache, congestion, sore throat,  fever 5. COUGH: "Do you have a cough?" If Yes, ask: "How bad is the cough?"      dry 6. FEVER: "Do you have a fever?" If Yes, ask: "What is your temperature, how was it measured, and when did it start?"     Yes 104 at highest today no fever 7. RESPIRATORY STATUS: "Describe your breathing?" (e.g., shortness of breath, wheezing, unable to speak)      no 8. BETTER-SAME-WORSE: "Are you getting better, staying the same or getting worse compared to yesterday?"  If getting worse, ask, "In what way?"     same 9. HIGH RISK DISEASE: "Do you have any chronic medical problems?" (e.g., asthma, heart or lung disease, weak immune system, obesity, etc.)     none 10. PREGNANCY: "Is there any chance you are pregnant?" "When was your last menstrual period?"      No birth control 28. OTHER SYMPTOMS: "Do you have any other symptoms?"  (e.g., chills, fatigue, headache, loss of smell or taste, muscle pain, sore throat; new loss of smell or taste especially support the diagnosis of COVID-19)     Chills, loss of taste, headache, dizziness,  Protocols used: CORONAVIRUS (COVID-19)  DIAGNOSED OR SUSPECTED-A-AH

## 2020-05-19 ENCOUNTER — Telehealth: Payer: Self-pay

## 2020-05-19 DIAGNOSIS — R059 Cough, unspecified: Secondary | ICD-10-CM

## 2020-05-19 NOTE — Telephone Encounter (Signed)
Copied from CRM [id number]. Topic: Quick Communication - See Telephone Encounter >> May 19, 2020 12:50 PM Aretta Nip wrote: CRM for notification. See Telephone encounter for: 05/19/20. Pt [phone] [id number] Pt wants a call about an in office visit, has a cold and a cough that will not stop. Has to wear mask at work and not able to wear mask as continual cough, called during lunch, wanted to come in. FU

## 2020-05-19 NOTE — Telephone Encounter (Signed)
Schedule for this afternoon.

## 2020-05-20 ENCOUNTER — Ambulatory Visit
Admission: EM | Admit: 2020-05-20 | Discharge: 2020-05-20 | Discharge status: Absent without leave | Payer: BLUE CROSS/BLUE SHIELD

## 2020-05-20 ENCOUNTER — Ambulatory Visit
Admission: EM | Admit: 2020-05-20 | Discharge: 2020-05-20 | Disposition: A | Discharge status: Home / Self Care | Payer: BLUE CROSS/BLUE SHIELD | Attending: Family Medicine | Admitting: Family Medicine

## 2020-05-20 ENCOUNTER — Ambulatory Visit: Payer: BLUE CROSS/BLUE SHIELD | Admitting: Family Medicine

## 2020-05-20 ENCOUNTER — Other Ambulatory Visit: Payer: Self-pay

## 2020-05-20 DIAGNOSIS — R059 Cough, unspecified: Secondary | ICD-10-CM | POA: Diagnosis not present

## 2020-05-20 DIAGNOSIS — R0981 Nasal congestion: Secondary | ICD-10-CM | POA: Diagnosis not present

## 2020-05-20 DIAGNOSIS — J069 Acute upper respiratory infection, unspecified: Secondary | ICD-10-CM | POA: Diagnosis not present

## 2020-05-20 MED ORDER — FLUTICASONE PROPIONATE 50 MCG/ACT NA SUSP: 2.0000 | Freq: Every day | NASAL | 2 refills | Status: DC

## 2020-05-20 MED ORDER — HYDROCOD POLST-CPM POLST ER 10-8 MG/5ML PO SUER: 5.0000 mL | Freq: Two times a day (BID) | ORAL | 0 refills | Status: DC | PRN

## 2020-05-20 MED ORDER — HYDROCODONE-HOMATROPINE 5-1.5 MG/5ML PO SYRP: 5.0000 mL | ORAL_SOLUTION | Freq: Four times a day (QID) | ORAL | 0 refills | Status: DC | PRN

## 2020-05-20 NOTE — ED Provider Notes (Signed)
[facility]   [id number] 05/20/20 Arrival Time: 1430  YH:CWCB THROAT  SUBJECTIVE: History from: patient.  Tracey Hood is a 29 y.o. female who presents with abrupt onset of nasal congestion, headache, fatigue for 6 days. Denies sick exposure to Covid, strep, flu or mono, or precipitating event. Has negative history of Covid. Has not had Covid vaccines. Has tried robitussin with little relief. There are no aggravating symptoms. Reports she has this issue every year at this time.  Denies fever, chills, ear pain, sinus pain, rhinorrhea, SOB, wheezing, chest pain, nausea, rash, changes in bowel or bladder habits.    ROS: As per HPI.  All other pertinent ROS negative.     History reviewed. No pertinent past medical history. History reviewed. No pertinent surgical history. No Known Allergies No current facility-administered medications on file prior to encounter.   Current Outpatient Medications on File Prior to Encounter  Medication Sig Dispense Refill  . ibuprofen (ADVIL) 400 MG tablet Take 1 tablet (400 mg total) by mouth every 6 (six) hours as needed. 30 tablet 0   Social History   Socioeconomic History  . Marital status: Single    Spouse name: Not on file  . Number of children: Not on file  . Years of education: Not on file  . Highest education level: Not on file  Occupational History  . Not on file  Tobacco Use  . Smoking status: Never Smoker  . Smokeless tobacco: Never Used  Vaping Use  . Vaping Use: Never used  Substance and Sexual Activity  . Alcohol use: No  . Drug use: No  . Sexual activity: Never    Birth control/protection: Injection  Other Topics Concern  . Not on file  Social History Narrative  . Not on file   Social Determinants of Health   Financial Resource Strain:   . Difficulty of Paying Living Expenses: Not on file  Food Insecurity:   . Worried About Programme researcher, broadcasting/film/video in the Last Year: Not on file  . Ran Out of Food in the  Last Year: Not on file  Transportation Needs:   . Lack of Transportation (Medical): Not on file  . Lack of Transportation (Non-Medical): Not on file  Physical Activity:   . Days of Exercise per Week: Not on file  . Minutes of Exercise per Session: Not on file  Stress:   . Feeling of Stress : Not on file  Social Connections:   . Frequency of Communication with Friends and Family: Not on file  . Frequency of Social Gatherings with Friends and Family: Not on file  . Attends Religious Services: Not on file  . Active Member of Clubs or Organizations: Not on file  . Attends Banker Meetings: Not on file  . Marital Status: Not on file  Intimate Partner Violence:   . Fear of Current or Ex-Partner: Not on file  . Emotionally Abused: Not on file  . Physically Abused: Not on file  . Sexually Abused: Not on file   Family History  Problem Relation Age of Onset  . Hypertension Father   . Alcohol abuse Neg Hx   . Arthritis Neg Hx   . Birth defects Neg Hx   . Cancer Neg Hx   . Drug abuse Neg Hx   . Asthma Neg Hx   . Diabetes Neg Hx   . COPD Neg Hx   . Depression Neg Hx   . Early death Neg Hx   .  Hearing loss Neg Hx   . Heart disease Neg Hx   . Hyperlipidemia Neg Hx   . Kidney disease Neg Hx   . Learning disabilities Neg Hx   . Mental illness Neg Hx   . Mental retardation Neg Hx   . Miscarriages / Stillbirths Neg Hx   . Stroke Neg Hx   . Vision loss Neg Hx   . Varicose Veins Neg Hx     OBJECTIVE:  Vitals:   05/20/20 1502 05/20/20 1503  BP:  113/70  Pulse:  66  Resp:  17  Temp:  98.3 F (36.8 C)  TempSrc:  Oral  SpO2:  100%  Weight: 126 lb (57.2 kg)   Height: 5\' 3"  (1.6 m)      General appearance: alert; appears fatigued, but nontoxic, speaking in full sentences and managing own secretions HEENT: NCAT; Ears: EACs clear, TMs pearly gray with visible cone of light, without erythema; Eyes: PERRL, EOMI grossly; Nose: no obvious rhinorrhea; Throat: oropharynx  erythematous, cobblestoning present, tonsils 1+ and mildly erythematous without white tonsillar exudates, uvula midline; Sinuses: tender to palpation Neck: supple without LAD Lungs: CTA bilaterally without adventitious breath sounds; cough absent Heart: regular rate and rhythm.  Radial pulses 2+ symmetrical bilaterally Skin: warm and dry Psychological: alert and cooperative; normal mood and affect  LABS: No results found for this or any previous visit (from the past 24 hour(s)).   ASSESSMENT & PLAN:  1. Upper respiratory tract infection, unspecified type   2. Nasal congestion   3. Cough     Meds ordered this encounter  Medications  . fluticasone (FLONASE) 50 MCG/ACT nasal spray    Sig: Place 2 sprays into both nostrils daily.    Dispense:  9.9 mL    Refill:  2    Order Specific Question:   Supervising Provider    Answer:   Merrilee Jansky  . HYDROcodone-homatropine (HYCODAN) 5-1.5 MG/5ML syrup    Sig: Take 5 mLs by mouth every 6 (six) hours as needed for cough.    Dispense:  120 mL    Refill:  0    Order Specific Question:   Supervising Provider    Answer:   [id number] Merrilee Jansky    URI Push fluids and get rest Prescribed flonase Prescribed Hycodan Cough medication sedation precautions given Take as directed and to completion.  Drink warm or cool liquids, use throat lozenges, or popsicles to help alleviate symptoms Take OTC ibuprofen or tylenol as needed for pain May use Zyrtec D and flonase to help alleviate symptoms Follow up with PCP if symptoms persist Return or go to ER if you have any new or worsening symptoms such as fever, chills, nausea, vomiting, worsening sore throat, cough, abdominal pain, chest pain, changes in bowel or bladder habits.   Reviewed expectations re: course of current medical issues. Questions answered. Outlined signs and symptoms indicating need for more acute intervention. Patient verbalized understanding. After Visit  Summary given.          [[id number]], NP 05/20/20 1536

## 2020-05-20 NOTE — ED Triage Notes (Signed)
Patient complains of cough and nasal congestion x 6 days.

## 2020-05-20 NOTE — Discharge Instructions (Signed)
I have sent in flonase for you to use 2 sprays daily as needed for nasal congestion  I have sent in Hycodan cough syrup for you to use 66mL every 6 hours as needed for cough  Follow up with this office or with primary care if symptoms are persisting.  Follow up in the ER for high fever, trouble swallowing, trouble breathing, other concerning symptoms.

## 2020-05-21 ENCOUNTER — Ambulatory Visit: Payer: BLUE CROSS/BLUE SHIELD | Admitting: Family Medicine

## 2020-05-27 ENCOUNTER — Encounter: Payer: Self-pay | Admitting: Family Medicine

## 2020-05-27 ENCOUNTER — Ambulatory Visit: Payer: BLUE CROSS/BLUE SHIELD | Admitting: Family Medicine

## 2020-05-27 ENCOUNTER — Other Ambulatory Visit: Payer: Self-pay

## 2020-05-27 VITALS — BP 100/70 | HR 76 | Ht 63.0 in | Wt 127.0 lb

## 2020-05-27 DIAGNOSIS — R059 Cough, unspecified: Secondary | ICD-10-CM

## 2020-05-27 DIAGNOSIS — J0100 Acute maxillary sinusitis, unspecified: Secondary | ICD-10-CM | POA: Diagnosis not present

## 2020-05-27 MED ORDER — AZITHROMYCIN 250 MG PO TABS: ORAL_TABLET | ORAL | 0 refills | Status: DC

## 2020-05-27 NOTE — Progress Notes (Signed)
Date:  05/27/2020   Name:  Tracey Hood   DOB:  Dec 14, 1990   MRN:  [id number]   Chief Complaint: Cough (still having cough and nasal drainage. Congested)  Cough This is a new problem. The current episode started in the past 7 days. The problem has been waxing and waning. The cough is productive of purulent sputum. Pertinent negatives include no chest pain, chills, ear congestion, ear pain, eye redness, fever, headaches, heartburn, hemoptysis, myalgias, nasal congestion, postnasal drip, rash, rhinorrhea, sore throat, shortness of breath, sweats, weight loss or wheezing. The symptoms are aggravated by pollens. She has tried OTC cough suppressant for the symptoms. There is no history of environmental allergies.  Sinusitis This is a new problem. The current episode started 1 to 4 weeks ago. The problem has been gradually improving since onset. There has been no fever. Associated symptoms include congestion, coughing and sinus pressure. Pertinent negatives include no chills, ear pain, headaches, shortness of breath, sneezing or sore throat. (Postnasal drainage)    Lab Results  Component Value Date   CREATININE 0.83 08/05/2017   BUN 14 08/05/2017   NA 140 08/05/2017   K 4.3 08/05/2017   CL 106 08/05/2017   CO2 18 (L) 08/05/2017   No results found for: CHOL, HDL, LDLCALC, LDLDIRECT, TRIG, CHOLHDL Lab Results  Component Value Date   TSH 0.608 08/05/2017   No results found for: HGBA1C Lab Results  Component Value Date   WBC 7.1 08/05/2017   HGB 12.7 08/05/2017   HCT 38.0 08/05/2017   MCV 94 08/05/2017   PLT 199 08/05/2017   Lab Results  Component Value Date   ALT 16 08/05/2017   AST 16 08/05/2017   ALKPHOS 50 08/05/2017   BILITOT 0.8 08/05/2017     Review of Systems  Constitutional: Negative.  Negative for chills, fatigue, fever, unexpected weight change and weight loss.  HENT: Positive for congestion and sinus pressure. Negative for ear discharge, ear pain, postnasal  drip, rhinorrhea, sneezing and sore throat.   Eyes: Negative for photophobia, pain, discharge, redness and itching.  Respiratory: Positive for cough. Negative for hemoptysis, shortness of breath, wheezing and stridor.   Cardiovascular: Negative for chest pain.  Gastrointestinal: Negative for abdominal pain, blood in stool, constipation, diarrhea, heartburn, nausea and vomiting.  Endocrine: Negative for cold intolerance, heat intolerance, polydipsia, polyphagia and polyuria.  Genitourinary: Negative for dysuria, flank pain, frequency, hematuria, menstrual problem, pelvic pain, urgency, vaginal bleeding and vaginal discharge.  Musculoskeletal: Negative for arthralgias, back pain and myalgias.  Skin: Negative for rash.  Allergic/Immunologic: Negative for environmental allergies and food allergies.  Neurological: Negative for dizziness, weakness, light-headedness, numbness and headaches.  Hematological: Negative for adenopathy. Does not bruise/bleed easily.  Psychiatric/Behavioral: Negative for dysphoric mood. The patient is not nervous/anxious.     Patient Active Problem List   Diagnosis Date Noted  . Indication for care in labor and delivery, antepartum 12/13/2014  . Back pain affecting pregnancy 12/11/2014  . Abdominal pain 12/10/2014  . Pregnancy 11/27/2014  . Encounter for suspected PROM, with rupture of membranes not found 11/27/2014  . Indication for care or intervention related to labor and delivery 11/18/2014    No Known Allergies  No past surgical history on file.  Social History   Tobacco Use  . Smoking status: Never Smoker  . Smokeless tobacco: Never Used  Vaping Use  . Vaping Use: Never used  Substance Use Topics  . Alcohol use: No  . Drug use: No  Medication list has been reviewed and updated.  Current Meds  Medication Sig  . fluticasone (FLONASE) 50 MCG/ACT nasal spray Place 2 sprays into both nostrils daily.    PHQ 2/9 Scores 05/27/2020 08/05/2017  PHQ -  2 Score 0 0  PHQ- 9 Score 0 7    GAD 7 : Generalized Anxiety Score 05/27/2020  Nervous, Anxious, on Edge 0  Control/stop worrying 0  Worry too much - different things 0  Trouble relaxing 0  Restless 0  Easily annoyed or irritable 0  Afraid - awful might happen 0  Total GAD 7 Score 0    BP Readings from Last 3 Encounters:  05/27/20 100/70  05/20/20 113/70  12/17/19 110/80    Physical Exam Vitals reviewed.  Constitutional:      Appearance: She is well-developed.  HENT:     Head: Normocephalic.     Right Ear: Tympanic membrane, ear canal and external ear normal.     Left Ear: Tympanic membrane, ear canal and external ear normal.     Nose:     Right Turbinates: Not enlarged or swollen.     Left Turbinates: Not enlarged or swollen.     Right Sinus: Maxillary sinus tenderness present. No frontal sinus tenderness.     Left Sinus: Maxillary sinus tenderness present. No frontal sinus tenderness.     Mouth/Throat:     Mouth: Mucous membranes are moist.     Pharynx: Posterior oropharyngeal erythema present.  Eyes:     General: Lids are everted, no foreign bodies appreciated. No scleral icterus.       Left eye: No foreign body or hordeolum.     Conjunctiva/sclera: Conjunctivae normal.     Right eye: Right conjunctiva is not injected.     Left eye: Left conjunctiva is not injected.     Pupils: Pupils are equal, round, and reactive to light.  Neck:     Thyroid: No thyromegaly.     Vascular: No JVD.     Trachea: No tracheal deviation.  Cardiovascular:     Rate and Rhythm: Normal rate and regular rhythm.     Heart sounds: Normal heart sounds. No murmur heard.  No friction rub. No gallop.   Pulmonary:     Effort: Pulmonary effort is normal. No respiratory distress.     Breath sounds: Normal breath sounds. No decreased breath sounds, wheezing, rhonchi or rales.  Abdominal:     General: Bowel sounds are normal.     Palpations: Abdomen is soft. There is no mass.     Tenderness:  There is no abdominal tenderness. There is no guarding or rebound.  Musculoskeletal:        General: No tenderness. Normal range of motion.     Cervical back: Normal range of motion and neck supple.  Lymphadenopathy:     Cervical: No cervical adenopathy.  Skin:    General: Skin is warm.     Findings: No rash.  Neurological:     Mental Status: She is alert and oriented to person, place, and time.     Cranial Nerves: No cranial nerve deficit.     Deep Tendon Reflexes: Reflexes normal.  Psychiatric:        Mood and Affect: Mood is not anxious or depressed.     Wt Readings from Last 3 Encounters:  05/27/20 127 lb (57.6 kg)  05/20/20 126 lb (57.2 kg)  12/17/19 123 lb (55.8 kg)    BP 100/70   Pulse 76  Ht 5\' 3"  (1.6 m)   Wt 127 lb (57.6 kg)   BMI 22.50 kg/m   Assessment and Plan: 1. Acute non-recurrent maxillary sinusitis Acute.  Persistent.  Stable.  History and exam are consistent with a acute maxillary sinusitis with tenderness over both maxillary sinuses.  We will treat with a azithromycin to 50 mg 2 today followed by 1 a day for 4 days.  2. Cough Chronic.  Persistent.  Likely secondary to postnasal drainage.  As patient has clearing of the cough.  There is no pneumonia detected on examination.  We will treat with Mucinex DM as directed.

## 2020-08-07 ENCOUNTER — Other Ambulatory Visit: Payer: BLUE CROSS/BLUE SHIELD

## 2021-01-08 ENCOUNTER — Ambulatory Visit
Admission: EM | Admit: 2021-01-08 | Discharge: 2021-01-08 | Disposition: A | Discharge status: Home / Self Care | Payer: BLUE CROSS/BLUE SHIELD | Attending: Family Medicine | Admitting: Family Medicine

## 2021-01-08 ENCOUNTER — Other Ambulatory Visit: Payer: Self-pay

## 2021-01-08 DIAGNOSIS — J358 Other chronic diseases of tonsils and adenoids: Secondary | ICD-10-CM | POA: Diagnosis not present

## 2021-01-08 NOTE — Discharge Instructions (Addendum)
Salt water gargles.  Take care  Dr. Adriana Simas

## 2021-01-08 NOTE — ED Triage Notes (Signed)
Patient states that she thinks she has a tonsil stone. States that she removed one last month and its back again.

## 2021-01-08 NOTE — ED Provider Notes (Signed)
[name]    CSN: [id number] Arrival date & time: 01/08/21  1907      History   Chief Complaint Chief Complaint  Patient presents with   Sore Throat    HPI  30 year old female presents with the above complaint.   Patient states that last month she had a sore throat and noted which she believes is a tonsil stone.  She states that this has recurred.  She believes that she is experiencing a tonsil stone on the right side and this is causing her sore throat.  She has no other reported symptoms.  She states that she wants to be examined and confirm what it is that she is experiencing.  No other reported symptoms.  No other complaints.   Patient Active Problem List   Diagnosis Date Noted   Indication for care in labor and delivery, antepartum 12/13/2014   Back pain affecting pregnancy 12/11/2014   Abdominal pain 12/10/2014   Pregnancy 11/27/2014   Encounter for suspected PROM, with rupture of membranes not found 11/27/2014   Indication for care or intervention related to labor and delivery 11/18/2014    Home Medications    Prior to Admission medications   Not on File    Family History Family History  Problem Relation Age of Onset   Hypertension Father    Alcohol abuse Neg Hx    Arthritis Neg Hx    Birth defects Neg Hx    Cancer Neg Hx    Drug abuse Neg Hx    Asthma Neg Hx    Diabetes Neg Hx    COPD Neg Hx    Depression Neg Hx    Early death Neg Hx    Hearing loss Neg Hx    Heart disease Neg Hx    Hyperlipidemia Neg Hx    Kidney disease Neg Hx    Learning disabilities Neg Hx    Mental illness Neg Hx    Mental retardation Neg Hx    Miscarriages / Stillbirths Neg Hx    Stroke Neg Hx    Vision loss Neg Hx    Varicose Veins Neg Hx     Social History Social History   Tobacco Use   Smoking status: Never   Smokeless tobacco: Never  Vaping Use   Vaping Use: Never used  Substance Use Topics   Alcohol use: No   Drug use: No     Allergies    Patient has no known allergies.   Review of Systems Review of Systems  Constitutional: Negative.   HENT:  Positive for sore throat.     Physical Exam Triage Vital Signs ED Triage Vitals  Enc Vitals Group     BP 01/08/21 1916 125/71     Pulse Rate 01/08/21 1916 72     Resp 01/08/21 1916 18     Temp 01/08/21 1916 99 F (37.2 C)     Temp Source 01/08/21 1916 Oral     SpO2 01/08/21 1916 99 %     Weight 01/08/21 1914 140 lb (63.5 kg)     Height 01/08/21 1914 5\' 3"  (1.6 m)     Head Circumference --      Peak Flow --      Pain Score 01/08/21 1914 0     Pain Loc --      Pain Edu? --      Excl. in GC? --    Updated Vital Signs BP 125/71 (BP Location: Right Arm)  Pulse 72   Temp 99 F (37.2 C) (Oral)   Resp 18   Ht 5\' 3"  (1.6 m)   Wt 63.5 kg   SpO2 99%   BMI 24.80 kg/m   Visual Acuity Right Eye Distance:   Left Eye Distance:   Bilateral Distance:    Right Eye Near:   Left Eye Near:    Bilateral Near:     Physical Exam Vitals and nursing note reviewed.  Constitutional:      General: She is not in acute distress.    Appearance: She is well-developed. She is not ill-appearing.  HENT:     Head: Normocephalic and atraumatic.     Mouth/Throat:     Comments: Right tonsil stone noted. No significant erythema or exudate. Pulmonary:     Effort: Pulmonary effort is normal. No respiratory distress.  Neurological:     Mental Status: She is alert.  Psychiatric:        Mood and Affect: Mood normal.        Behavior: Behavior normal.     UC Treatments / Results  Labs (all labs ordered are listed, but only abnormal results are displayed) Labs Reviewed - No data to display  EKG   Radiology No results found.  Procedures Procedures (including critical care time)  Medications Ordered in UC Medications - No data to display  Initial Impression / Assessment and Plan / UC Course  I have reviewed the triage vital signs and the nursing notes.  Pertinent labs &  imaging results that were available during my care of the patient were reviewed by me and considered in my medical decision making (see chart for details).    30 year old female presents with a tonsil stone. Partially removed today. Advised salt water gargles. Supportive care.   Final Clinical Impressions(s) / UC Diagnoses   Final diagnoses:  Tonsillolith     Discharge Instructions      Salt water gargles.  Take care  Dr. 34    ED Prescriptions   None    PDMP not reviewed this encounter.   Tracey Hood, [name] 01/08/21 2001

## 2021-01-21 ENCOUNTER — Other Ambulatory Visit: Payer: Self-pay

## 2021-01-21 ENCOUNTER — Ambulatory Visit
Admission: EM | Admit: 2021-01-21 | Discharge: 2021-01-21 | Disposition: A | Discharge status: Home / Self Care | Payer: BLUE CROSS/BLUE SHIELD | Attending: Sports Medicine | Admitting: Sports Medicine

## 2021-01-21 DIAGNOSIS — M940 Chondrocostal junction syndrome [Tietze]: Secondary | ICD-10-CM

## 2021-01-21 DIAGNOSIS — R0789 Other chest pain: Secondary | ICD-10-CM

## 2021-01-21 MED ORDER — IBUPROFEN 600 MG PO TABS: 600.0000 mg | ORAL_TABLET | Freq: Four times a day (QID) | ORAL | 0 refills | Status: DC | PRN

## 2021-01-21 NOTE — ED Provider Notes (Signed)
[name]    CSN: [id number] Arrival date & time: 01/21/21  1048      History   Chief Complaint Chief Complaint  Patient presents with   Chest Pain    HPI Tracey Hood is a 30 y.o. female.   30 year old female who presents for evaluation of left-sided lower chest pain that radiates a little bit into the sternum.  It is worse with taking a deep breath.  It began about an hour ago.  No accidents trauma or falls.  She normally sees Dr. Yetta Barre for her ongoing medical care but cannot get an appointment today.  She works as a Water quality scientist.  It is not overly physical.  She is generally healthy and does not take any medicines on a regular basis.  No chronic medical conditions.  She denies any headache, diaphoresis, jaw pain, history of cardiac disease, leg pain or arm pain or leg or arm swelling.  Symptoms are actually improving since they began.  She called her mother and she insisted that she come to the urgent care for evaluation.  No URI symptoms, no cough, runny nose, sore throat, or headache.  No history of DVT or PE.  No history of asthma or seasonal allergies.  No other issues or problems are offered when conducting a thorough history.   History reviewed. No pertinent past medical history.  Patient Active Problem List   Diagnosis Date Noted   Indication for care in labor and delivery, antepartum 12/13/2014   Back pain affecting pregnancy 12/11/2014   Abdominal pain 12/10/2014   Pregnancy 11/27/2014   Encounter for suspected PROM, with rupture of membranes not found 11/27/2014   Indication for care or intervention related to labor and delivery 11/18/2014    History reviewed. No pertinent surgical history.  OB History     Gravida  2   Para  2   Term  2   Preterm  0   AB  0   Living  1      SAB  0   IAB  0   Ectopic  0   Multiple  0   Live Births  1            Home Medications    Prior to Admission medications   Medication  Sig Start Date End Date Taking? Authorizing Provider  ibuprofen (ADVIL) 600 MG tablet Take 1 tablet (600 mg total) by mouth every 6 (six) hours as needed. 01/21/21  Yes Delton See, MD    Family History Family History  Problem Relation Age of Onset   Hypertension Father    Alcohol abuse Neg Hx    Arthritis Neg Hx    Birth defects Neg Hx    Cancer Neg Hx    Drug abuse Neg Hx    Asthma Neg Hx    Diabetes Neg Hx    COPD Neg Hx    Depression Neg Hx    Early death Neg Hx    Hearing loss Neg Hx    Heart disease Neg Hx    Hyperlipidemia Neg Hx    Kidney disease Neg Hx    Learning disabilities Neg Hx    Mental illness Neg Hx    Mental retardation Neg Hx    Miscarriages / Stillbirths Neg Hx    Stroke Neg Hx    Vision loss Neg Hx    Varicose Veins Neg Hx     Social History Social History   Tobacco Use  Smoking status: Never   Smokeless tobacco: Never  Vaping Use   Vaping Use: Never used  Substance Use Topics   Alcohol use: No   Drug use: No     Allergies   Patient has no known allergies.   Review of Systems Review of Systems  Constitutional:  Negative for activity change, appetite change, chills, diaphoresis, fatigue and fever.  HENT:  Negative for congestion, ear pain, postnasal drip, rhinorrhea, sinus pressure, sinus pain, sneezing and sore throat.   Eyes:  Negative for pain.  Respiratory:  Positive for chest tightness. Negative for cough, shortness of breath, wheezing and stridor.   Cardiovascular:  Positive for chest pain. Negative for palpitations.       Left-sided lower rib pain with deep inspiration  Gastrointestinal:  Negative for abdominal pain, diarrhea, nausea and vomiting.  Genitourinary:  Negative for dysuria.  Musculoskeletal:  Negative for back pain, myalgias, neck pain and neck stiffness.  Skin:  Negative for color change, pallor, rash and wound.  Neurological:  Negative for dizziness, light-headedness, numbness and headaches.  All other systems  reviewed and are negative.   Physical Exam Triage Vital Signs ED Triage Vitals  Enc Vitals Group     BP 01/21/21 1122 109/71     Pulse Rate 01/21/21 1122 75     Resp 01/21/21 1122 18     Temp 01/21/21 1122 98.7 F (37.1 C)     Temp Source 01/21/21 1122 Oral     SpO2 01/21/21 1122 100 %     Weight --      Height --      Head Circumference --      Peak Flow --      Pain Score 01/21/21 1120 5     Pain Loc --      Pain Edu? --      Excl. in GC? --    No data found.  Updated Vital Signs BP 109/71 (BP Location: Right Arm)   Pulse 75   Temp 98.7 F (37.1 C) (Oral)   Resp 18   SpO2 100%   Visual Acuity Right Eye Distance:   Left Eye Distance:   Bilateral Distance:    Right Eye Near:   Left Eye Near:    Bilateral Near:     Physical Exam Vitals and nursing note reviewed.  Constitutional:      General: She is not in acute distress.    Appearance: Normal appearance. She is well-developed. She is not ill-appearing, toxic-appearing or diaphoretic.  HENT:     Head: Normocephalic and atraumatic.     Nose: Nose normal.     Mouth/Throat:     Mouth: Mucous membranes are moist.  Eyes:     Extraocular Movements: Extraocular movements intact.     Conjunctiva/sclera: Conjunctivae normal.     Pupils: Pupils are equal, round, and reactive to light.  Neck:     Vascular: No JVD.     Trachea: No tracheal deviation.  Cardiovascular:     Rate and Rhythm: Normal rate and regular rhythm. No extrasystoles are present.    Chest Wall: PMI is displaced.     Pulses: Normal pulses.          Carotid pulses are 2+ on the right side and 2+ on the left side.      Radial pulses are 2+ on the right side and 2+ on the left side.       Dorsalis pedis pulses are 2+ on the right side  and 2+ on the left side.       Posterior tibial pulses are 2+ on the right side and 2+ on the left side.     Heart sounds: Normal heart sounds. Heart sounds not distant. No murmur heard.   No friction rub. No gallop.  No S3 or S4 sounds.  Pulmonary:     Effort: Pulmonary effort is normal.     Breath sounds: Normal breath sounds. No stridor. No decreased breath sounds, wheezing, rhonchi or rales.  Chest:     Chest wall: No deformity or tenderness.  Musculoskeletal:     Cervical back: Normal range of motion and neck supple.  Skin:    General: Skin is warm and dry.     Capillary Refill: Capillary refill takes less than 2 seconds.  Neurological:     General: No focal deficit present.     Mental Status: She is alert and oriented to person, place, and time.     UC Treatments / Results  Labs (all labs ordered are listed, but only abnormal results are displayed) Labs Reviewed - No data to display  EKG Twelve-lead EKG was ordered and interpreted by myself here in the clinic today.  January 21, 2021 at 11:33 AM.  Shows sinus bradycardia with a ventricular rate of 58 bpm.  PR interval of 144 ms.  QRS duration of 88 ms.  QT and QTc of 410 and 402 ms.  There is a nonspecific T wave abnormality but no acute ST elevation.  No prior EKG to compare to.  Radiology No results found.  Procedures Procedures (including critical care time)  Medications Ordered in UC Medications - No data to display  Initial Impression / Assessment and Plan / UC Course  I have reviewed the triage vital signs and the nursing notes.  Pertinent labs & imaging results that were available during my care of the patient were reviewed by me and considered in my medical decision making (see chart for details).  Clinical impression: Left-sided lower rib pain acute onset with some discomfort radiating to the substernal border.  Examination, vital signs, and EKG are all reassuring.  Seems consistent with costochondritis versus atypical chest wall pain or atypical chest pain.  Treatment plan: 1.  The findings and treatment plan were discussed in detail with the patient.  Patient was in agreement. 2.  Recommended getting a twelve-lead EKG.   Results are above. 3.  I discussed with her that her presentation was most in line with costochondritis and will treat with a prescription strength anti-inflammatory medication. 4.  Plenty of rest, plenty of fluids, Tylenol in addition to the prescribed anti-inflammatory as needed. 5.  She may have a component of reflux and I have asked her to consider purchasing Prilosec OTC. 6.  I encouraged her to get into her PCP sometime this week for recheck.  This is to ensure that she is going in the right direction. 7.  Discussed the red flag signs and symptoms and when to seek out immediate medical attention.  This included but was not limited to worsening chest pain, shortness of breath, impending doom, diaphoresis, or jaw pain.  I advised her that she should call 911 and go directly to the nearest emergency room if this was to occur.  She voiced verbal understanding. 8.  I did provide her a work note keep her out of work today.  Hopefully with the anti-inflammatory medicine she can go back to work tomorrow. 9.  She was discharged  in stable condition and she will follow-up here as needed.    Final Clinical Impressions(s) / UC Diagnoses   Final diagnoses:  Atypical chest pain  Costochondritis  Chest wall pain     Discharge Instructions      As we discussed, your EKG did not show any concerning features for heart attack.  Your history and physical exam as well as vital signs are very reassuring. I believe your presentation is mostly in line with a diagnosis of costochondritis which is treated with anti-inflammatory medication.  Plenty of water when taking this and please take it with food. You can consider purchasing some over-the-counter Prilosec OTC in case this may be some form of reflux. I would encourage you to call your primary care provider and get in sometime this week or next week for a recheck to ensure that you are going in the right direction. If your symptoms were to worsen in any way  and you become short of breath or your chest pain worsens then please call 911 and go to the ER for higher level of care and potential scanning which we cannot provide in the urgent care setting.     ED Prescriptions     Medication Sig Dispense Auth. Provider   ibuprofen (ADVIL) 600 MG tablet Take 1 tablet (600 mg total) by mouth every 6 (six) hours as needed. 30 tablet Delton See, MD      PDMP not reviewed this encounter.   Delton See, MD 01/22/21 1515

## 2021-01-21 NOTE — ED Triage Notes (Signed)
Pt presents with pain in L lower chest/LUQ area radiating to midsternal x approx 1 hour.  Worse when taking a deep breath.  Denies nausea, vomiting, dizziness.  Was sedentary when it started.

## 2021-01-21 NOTE — Discharge Instructions (Addendum)
As we discussed, your EKG did not show any concerning features for heart attack.  Your history and physical exam as well as vital signs are very reassuring. I believe your presentation is mostly in line with a diagnosis of costochondritis which is treated with anti-inflammatory medication.  Plenty of water when taking this and please take it with food. You can consider purchasing some over-the-counter Prilosec OTC in case this may be some form of reflux. I would encourage you to call your primary care provider and get in sometime this week or next week for a recheck to ensure that you are going in the right direction. If your symptoms were to worsen in any way and you become short of breath or your chest pain worsens then please call 911 and go to the ER for higher level of care and potential scanning which we cannot provide in the urgent care setting.

## 2021-04-22 ENCOUNTER — Ambulatory Visit
Admission: EM | Admit: 2021-04-22 | Discharge: 2021-04-22 | Disposition: A | Discharge status: Home / Self Care | Payer: BLUE CROSS/BLUE SHIELD | Attending: Family Medicine | Admitting: Family Medicine

## 2021-04-22 ENCOUNTER — Other Ambulatory Visit: Payer: Self-pay

## 2021-04-22 DIAGNOSIS — M25561 Pain in right knee: Secondary | ICD-10-CM | POA: Diagnosis not present

## 2021-04-22 MED ORDER — MELOXICAM 15 MG PO TABS: 15.0000 mg | ORAL_TABLET | Freq: Every day | ORAL | 0 refills | Status: DC | PRN

## 2021-04-22 NOTE — ED Triage Notes (Signed)
Pt c/o right knee pain since last week. Pt states she has been working out more but does not recall any injury. Pt denies swelling or bruising to the knee. Pt has pain with bending movements.

## 2021-04-22 NOTE — Discharge Instructions (Signed)
Rest, ice.   Consider PT if persists.  Take care  Dr. Adriana Simas

## 2021-04-22 NOTE — ED Provider Notes (Signed)
[name]    CSN: [id number] Arrival date & time: 04/22/21  1830      History   Chief Complaint Chief Complaint  Patient presents with   Knee Pain    right    HPI 30 year old female presents with right knee pain.  She states that this started last Thursday.  She states that she recently has been working out by using an elliptical.  Patient states that she has right knee pain and localizes it anteriorly.  Occurs primarily with flexion of the knee and with certain activities.  No fall, trauma, or known injury.  No relieving factors.  No other associated symptoms.  No other complaints.  Patient Active Problem List   Diagnosis Date Noted   Indication for care in labor and delivery, antepartum 12/13/2014   Back pain affecting pregnancy 12/11/2014   Abdominal pain 12/10/2014   Pregnancy 11/27/2014   Encounter for suspected PROM, with rupture of membranes not found 11/27/2014   Indication for care or intervention related to labor and delivery 11/18/2014   OB History     Gravida  2   Para  2   Term  2   Preterm  0   AB  0   Living  1      SAB  0   IAB  0   Ectopic  0   Multiple  0   Live Births  1            Home Medications    Prior to Admission medications   Medication Sig Start Date End Date Taking? Authorizing Provider  levonorgestrel (MIRENA) 20 MCG/DAY IUD by Intrauterine route.   Yes [provider]  meloxicam (MOBIC) 15 MG tablet Take 1 tablet (15 mg total) by mouth daily as needed for pain. 04/22/21  Yes Tracey Sams, DO    Family History Family History  Problem Relation Age of Onset   Hypertension Father    Alcohol abuse Neg Hx    Arthritis Neg Hx    Birth defects Neg Hx    Cancer Neg Hx    Drug abuse Neg Hx    Asthma Neg Hx    Diabetes Neg Hx    COPD Neg Hx    Depression Neg Hx    Early death Neg Hx    Hearing loss Neg Hx    Heart disease Neg Hx    Hyperlipidemia Neg Hx    Kidney disease Neg Hx     Learning disabilities Neg Hx    Mental illness Neg Hx    Mental retardation Neg Hx    Miscarriages / Stillbirths Neg Hx    Stroke Neg Hx    Vision loss Neg Hx    Varicose Veins Neg Hx     Social History Social History   Tobacco Use   Smoking status: Never   Smokeless tobacco: Never  Vaping Use   Vaping Use: Never used  Substance Use Topics   Alcohol use: No   Drug use: No     Allergies   Patient has no known allergies.   Review of Systems Review of Systems Per HPI  Physical Exam Triage Vital Signs ED Triage Vitals  Enc Vitals Group     BP 04/22/21 1923 102/71     Pulse Rate 04/22/21 1923 80     Resp 04/22/21 1923 18     Temp 04/22/21 1923 98.6 F (37 C)     Temp Source 04/22/21 1923 Oral  SpO2 04/22/21 1923 100 %     Weight 04/22/21 1922 141 lb (64 kg)     Height 04/22/21 1922 5\' 4"  (1.626 m)     Head Circumference --      Peak Flow --      Pain Score 04/22/21 1921 6     Pain Loc --      Pain Edu? --      Excl. in GC? --    Updated Vital Signs BP 102/71 (BP Location: Left Arm)   Pulse 80   Temp 98.6 F (37 C) (Oral)   Resp 18   Ht 5\' 4"  (1.626 m)   Wt 64 kg   SpO2 100%   BMI 24.20 kg/m   Visual Acuity Right Eye Distance:   Left Eye Distance:   Bilateral Distance:    Right Eye Near:   Left Eye Near:    Bilateral Near:     Physical Exam Vitals and nursing note reviewed. Exam conducted with a chaperone present.  Constitutional:      General: She is not in acute distress.    Appearance: Normal appearance. She is not ill-appearing.  HENT:     Head: Normocephalic and atraumatic.  Eyes:     General:        Right eye: No discharge.        Left eye: No discharge.     Conjunctiva/sclera: Conjunctivae normal.  Musculoskeletal:     Comments: Right knee -no appreciable effusion.  No anterior joint line tenderness.  Ligaments intact.  Neurological:     Mental Status: She is alert.  Psychiatric:        Mood and Affect: Mood normal.         Behavior: Behavior normal.     UC Treatments / Results  Labs (all labs ordered are listed, but only abnormal results are displayed) Labs Reviewed - No data to display  EKG   Radiology No results found.  Procedures Procedures (including critical care time)  Medications Ordered in UC Medications - No data to display  Initial Impression / Assessment and Plan / UC Course  I have reviewed the triage vital signs and the nursing notes.  Pertinent labs & imaging results that were available during my care of the patient were reviewed by me and considered in my medical decision making (see chart for details).    30 year old female presents with acute knee pain.  Concern for possible patellofemoral syndrome.  Referral to PT placed.  Meloxicam as directed.  Advised rest, ice, elevation.    Final Clinical Impressions(s) / UC Diagnoses   Final diagnoses:  Acute pain of right knee     Discharge Instructions      Rest, ice.   Consider PT if persists.  Take care  Dr.    ED Prescriptions     Medication Sig Dispense Auth. Provider   meloxicam (MOBIC) 15 MG tablet Take 1 tablet (15 mg total) by mouth daily as needed for pain. 30 tablet 34, DO      PDMP not reviewed this encounter.   Tracey Hood, Tracey Hood 04/22/21 2024

## 2021-05-23 ENCOUNTER — Other Ambulatory Visit: Payer: Self-pay | Admitting: Family Medicine

## 2022-01-27 ENCOUNTER — Encounter: Payer: Self-pay | Admitting: Emergency Medicine

## 2022-01-27 ENCOUNTER — Ambulatory Visit
Admission: EM | Admit: 2022-01-27 | Discharge: 2022-01-27 | Disposition: A | Discharge status: Home / Self Care | Payer: BLUE CROSS/BLUE SHIELD | Attending: Emergency Medicine | Admitting: Emergency Medicine

## 2022-01-27 ENCOUNTER — Other Ambulatory Visit: Payer: Self-pay

## 2022-01-27 DIAGNOSIS — M25561 Pain in right knee: Secondary | ICD-10-CM

## 2022-01-27 MED ORDER — MELOXICAM 15 MG PO TABS: 15.0000 mg | ORAL_TABLET | Freq: Every day | ORAL | 0 refills | Status: DC | PRN

## 2022-01-27 NOTE — ED Provider Notes (Signed)
[name]    CSN: [id number] Arrival date & time: 01/27/22  1807      History   Chief Complaint Chief Complaint  Patient presents with   Knee Pain    right    HPI Tracey Hood is a 31 y.o. female.   Patient presents with right knee pain for 2 weeks.  Symptoms began without precipitating event, trauma or injury.  Pain has been constant, rated a 6 out of 10 described as a posterior pulling sensation when sitting in a discomfort with movement.  Symptoms are worsening with sitting and can be felt with bearing weight.  Pain is noted to be primarily on the lower anterior.  Has attempted use of compression and Tylenol which has been ineffective.  Denies numbness or tingling.  History reviewed. No pertinent past medical history.  Patient Active Problem List   Diagnosis Date Noted   Indication for care in labor and delivery, antepartum 12/13/2014   Back pain affecting pregnancy 12/11/2014   Abdominal pain 12/10/2014   Pregnancy 11/27/2014   Encounter for suspected PROM, with rupture of membranes not found 11/27/2014   Indication for care or intervention related to labor and delivery 11/18/2014    History reviewed. No pertinent surgical history.  OB History     Gravida  2   Para  2   Term  2   Preterm  0   AB  0   Living  1      SAB  0   IAB  0   Ectopic  0   Multiple  0   Live Births  1            Home Medications    Prior to Admission medications   Medication Sig Start Date End Date Taking? Authorizing Provider  levonorgestrel (MIRENA) 20 MCG/DAY IUD by Intrauterine route.   Yes [provider]  meloxicam (MOBIC) 15 MG tablet Take 1 tablet (15 mg total) by mouth daily as needed for pain. 04/22/21   [name], DO    Family History Family History  Problem Relation Age of Onset   Hypertension Father    Alcohol abuse Neg Hx    Arthritis Neg Hx    Birth defects Neg Hx    Cancer Neg Hx    Drug abuse Neg Hx     Asthma Neg Hx    Diabetes Neg Hx    COPD Neg Hx    Depression Neg Hx    Early death Neg Hx    Hearing loss Neg Hx    Heart disease Neg Hx    Hyperlipidemia Neg Hx    Kidney disease Neg Hx    Learning disabilities Neg Hx    Mental illness Neg Hx    Mental retardation Neg Hx    Miscarriages / Stillbirths Neg Hx    Stroke Neg Hx    Vision loss Neg Hx    Varicose Veins Neg Hx     Social History Social History   Tobacco Use   Smoking status: Never   Smokeless tobacco: Never  Vaping Use   Vaping Use: Never used  Substance Use Topics   Alcohol use: No   Drug use: No     Allergies   Patient has no known allergies.   Review of Systems Review of Systems  Constitutional: Negative.   Respiratory: Negative.    Musculoskeletal:  Positive for myalgias. Negative for arthralgias, back pain, gait problem, joint swelling, neck  pain and neck stiffness.  Skin: Negative.   Neurological: Negative.      Physical Exam Triage Vital Signs ED Triage Vitals  Enc Vitals Group     BP 01/27/22 1824 114/65     Pulse Rate 01/27/22 1824 72     Resp 01/27/22 1824 16     Temp 01/27/22 1824 98.8 F (37.1 C)     Temp Source 01/27/22 1824 Oral     SpO2 01/27/22 1824 100 %     Weight 01/27/22 1823 141 lb 1.5 oz (64 kg)     Height 01/27/22 1823 5\' 4"  (1.626 m)     Head Circumference --      Peak Flow --      Pain Score 01/27/22 1822 6     Pain Loc --      Pain Edu? --      Excl. in GC? --    No data found.  Updated Vital Signs BP 114/65 (BP Location: Left Arm)   Pulse 72   Temp 98.8 F (37.1 C) (Oral)   Resp 16   Ht 5\' 4"  (1.626 m)   Wt 141 lb 1.5 oz (64 kg)   SpO2 100%   BMI 24.22 kg/m   Visual Acuity Right Eye Distance:   Left Eye Distance:   Bilateral Distance:    Right Eye Near:   Left Eye Near:    Bilateral Near:     Physical Exam Constitutional:      Appearance: Normal appearance.  HENT:     Head: Normocephalic.  Eyes:     Extraocular Movements: Extraocular  movements intact.  Pulmonary:     Effort: Pulmonary effort is normal.  Musculoskeletal:     Comments: Tenderness is directly over the patellar tendon without ecchymosis, swelling present, range of motion is intact, able to bear weight, 2+ popliteal pulse  Neurological:     Mental Status: She is alert and oriented to person, place, and time. Mental status is at baseline.  Psychiatric:        Mood and Affect: Mood normal.        Behavior: Behavior normal.      UC Treatments / Results  Labs (all labs ordered are listed, but only abnormal results are displayed) Labs Reviewed - No data to display  EKG   Radiology No results found.  Procedures Procedures (including critical care time)  Medications Ordered in UC Medications - No data to display  Initial Impression / Assessment and Plan / UC Course  I have reviewed the triage vital signs and the nursing notes.  Pertinent labs & imaging results that were available during my care of the patient were reviewed by me and considered in my medical decision making (see chart for details).  Acute right knee pain  Discussed exam findings, low suspicion for ligament or meniscus involvement, low suspicion for bone involvement and will defer x-ray imaging today, patient has done well with meloxicam in the past, prescribed and recommended to take for 5 days then as needed, may use Tylenol in addition, may continue use of compression, advised elevation and ice or heat for additional supportive care, given walking referral to orthopedics if symptoms continue to persist or worsen Final Clinical Impressions(s) / UC Diagnoses   Final diagnoses:  None   Discharge Instructions   None    ED Prescriptions   None    PDMP not reviewed this encounter.   03/30/22, NP 01/27/22 1845

## 2022-01-27 NOTE — Discharge Instructions (Signed)
Based on your exam today I believe your discomfort is coming from inflammation to the patella tendon  Begin use of meloxicam every morning with food for 5 days then as this is to reduce the inflammatory process that occurs with injury and to help reduce your pain, you may use Tylenol 500 to 1000 mg every 6 hours in addition to this to strengthen pain control  Please ice or heat over the affected area 10 to 15-minute interval  May elevate your leg on 2 pillows while sitting and lying  Continue use of compression specifically with activities such as walking and standing  Your symptoms continue to persist or worsen please follow-up with orthopedics, information is listed on your paperwork for further evaluation and management

## 2022-01-27 NOTE — ED Triage Notes (Signed)
Pt c/o right knee pain. Started about 2 weeks ago. She states she fell down her moms driveway but has been a while ago and pain started back about 2 weeks ago.

## 2022-03-04 ENCOUNTER — Ambulatory Visit: Payer: BLUE CROSS/BLUE SHIELD | Admitting: Dietician

## 2022-04-15 ENCOUNTER — Encounter: Payer: BLUE CROSS/BLUE SHIELD | Attending: Certified Nurse Midwife | Admitting: Dietician

## 2022-04-15 ENCOUNTER — Encounter: Payer: Self-pay | Admitting: Dietician

## 2022-04-15 VITALS — Ht 63.0 in | Wt 161.0 lb

## 2022-04-15 DIAGNOSIS — E663 Overweight: Secondary | ICD-10-CM | POA: Diagnosis not present

## 2022-04-15 NOTE — Patient Instructions (Signed)
Use menus for ideas to add a variety of balanced meals that are simple to prepare. Other resources are meal planning apps such as [employer] Calorie goal is about 1400 daily. 300-500 per meal  Keep up the regular exercise at least 3 times a week.

## 2022-04-15 NOTE — Progress Notes (Signed)
Medical Nutrition Therapy: Visit start time: 1450  end time: 1545  Assessment:   Referral Diagnosis: overweight/ obesity Other medical history/ diagnoses: none significant Psychosocial issues/ stress concerns: reports moderate-high stress level, feels she is managing "okay"   Medications, supplements: reconciled list in medical record   Preferred learning method:  Hands-on   Current weight: 161.0lbs Height: 5'3" BMI: 28.52 Patient's personal weight goal: 120s - 130s  Progress and evaluation:  Patient reports weight gain in past year, was working on weight loss, but then had knee injury resulting in less activity so weight gain has resumed. She has recently increased physical activity again and is making diet changes to increase vegetables and fruits, eating smaller portions, making use of meal replacement shakes at times. She has been taking an OTC supplement to help with weight loss but it is not working. Has been eating salads frequently to decrease calories but now tiring of salads, would like to have other options of calorie-controlled meals to add variety. Food allergies: none Special diet practices: none Patient seeks help with ongoing weight loss and sustainable eating pattern with variety of foods    Dietary Intake:  Usual eating pattern includes 3 meals and 3 snacks per day. Dining out frequency: 2-3 meals per week. Who plans meals/ buys groceries? self Who prepares meals? self  Breakfast: usu starving -- small piece sausage and grits or oatmeal Snack: bell peppers cream cheese, salt and pepper Lunch: usually leftovers ie baked chicken and cabbage Snack: tortilla with peanut butter; slimfast snack or shake Supper: chicken/ frozen dinner/ quick meals Snack: none or same as am or pm Beverages: water, occasionally milk  Physical activity: walk/ bike/ jump rope + weights/ stiups/ stretches 20-30 minutes, 2-3 times a week   Intervention:   Nutrition Care Education:    Basic nutrition: basic food groups; appropriate nutrient balance; appropriate meal and snack schedule; general nutrition guidelines    Weight control: determining reasonable weight loss rate; importance of low sugar and low fat choices; appropriate food portions; estimated energy needs for weight loss at 1400 kcal, provided guidance for 45% CHO, 25% pro, 30% fat; discussed role of physical activity; option of tracking food intake Advanced nutrition:  cooking techniques-- meal prepping; options for simple and balanced meals  Other intervention notes: Patient has been making diet and lifestyle changes to promote weight loss and is motivated to continue. Established additional goals for change with direction from patient.  No follow up scheduled at this time; patient will schedule later if needed.   Nutritional Diagnosis:  [place]-3.3 Overweight/obesity As related to history of limited activity, excess calories.  As evidenced by patient with current BMI of 28.52, working on reduced calorie eating pattern and regular exercise to promote weight loss.   Education Materials given:  Museum/gallery conservator with food lists, sample meal pattern Sample menus Skillet meal template Visit summary with goals/ instructions   Learner/ who was taught:  Patient    Level of understanding: Verbalizes/ demonstrates competency  Demonstrated degree of understanding via:   Teach back Learning barriers: None  Willingness to learn/ readiness for change: Eager, change in progress   Monitoring and Evaluation:  Dietary intake, exercise, and body weight      follow up: prn

## 2022-06-11 ENCOUNTER — Ambulatory Visit: Payer: Self-pay | Admitting: *Deleted

## 2022-06-11 NOTE — Telephone Encounter (Signed)
I think I may be prediabetic.    Most symptoms are happening often.   I work long hours.   I forget to eat.   When that happens my blood sugar drops and I shake a lot.   I eat something sweet and that stops it.  I would like to come in and be tested.    I also get weak and my vision goes black sometimes.  Reason for Disposition  [1] MILD weakness (i.e., does not interfere with ability to work, go to school, normal activities) AND [2] persists > 1 week    Thinks she may have diabetes   Wants to be tested  Answer Assessment - Initial Assessment Questions 1. DESCRIPTION: "Describe how you are feeling."     I think I may have diabetes.   It runs in my family.  I feel weak at times.   When I'm working long hours I sometimes forget to eat.   I get shaky, weak and my mind is fuzzy and my vision goes black sometimes.   When I eat something sweet I feel much better.  The symptoms go away.    2. SEVERITY: "How bad is it?"  "Can you stand and walk?"   - MILD (0-3): Feels weak or tired, but does not interfere with work, school or normal activities.   - MODERATE (4-7): Able to stand and walk; weakness interferes with work, school, or normal activities.   - SEVERE (8-10): Unable to stand or walk; unable to do usual activities.     Mild   It's happening more and more 3. ONSET: "When did these symptoms begin?" (e.g., hours, days, weeks, months)     "For a while now and becoming more frequent" 4. CAUSE: "What do you think is causing the weakness or fatigue?" (e.g., not drinking enough fluids, medical problem, trouble sleeping)     I think I have diabetes   It runs in my family    They have these similar symptoms. 5. NEW MEDICINES:  "Have you started on any new medicines recently?" (e.g., opioid pain medicines, benzodiazepines, muscle relaxants, antidepressants, antihistamines, neuroleptics, beta blockers)     Not asked 6. OTHER SYMPTOMS: "Do you have any other symptoms?" (e.g., chest pain, fever, cough, SOB,  vomiting, diarrhea, bleeding, other areas of pain)     No 7. PREGNANCY: "Is there any chance you are pregnant?" "When was your last menstrual period?"     Not asked  Protocols used: Weakness (Generalized) and Fatigue-A-AH  Chief Complaint: Weakness, shaky, vision goes black, mind feels fuzzy when she forgets to eat.   Diabetes runs in her family so wants to be tested Symptoms: above Frequency: several times and happening more often Pertinent Negatives: Patient denies passing out Disposition: [] ED /[] Urgent Care (no appt availability in office) / [x] Appointment(In office/virtual)/ []  [facility]/ [] Home Care/ [] Refused Recommended Disposition /[] [facility]/ []  Follow-up with PCP Additional Notes: Appt. made

## 2022-06-14 ENCOUNTER — Encounter: Payer: Self-pay | Admitting: Family Medicine

## 2022-06-14 ENCOUNTER — Ambulatory Visit: Payer: BLUE CROSS/BLUE SHIELD | Admitting: Family Medicine

## 2022-06-14 VITALS — BP 90/60 | HR 72 | Ht 63.0 in | Wt 162.0 lb

## 2022-06-14 DIAGNOSIS — R5383 Other fatigue: Secondary | ICD-10-CM

## 2022-06-14 NOTE — Progress Notes (Signed)
Date:  06/14/2022   Name:  Tracey Hood   DOB:  [birth year]   MRN:  [id number]   Chief Complaint: shakiness (Will get shaky when she doesn't eat and towards end of day her vision "goes black" )  Eye Problem  Both (decrease visual acuity) eyes are affected. This is a new problem. The current episode started 1 to 4 weeks ago. The problem has been waxing and waning. There was no injury mechanism. The pain is mild ("beginning" of headache). Associated symptoms include blurred vision and photophobia. Pertinent negatives include no double vision, eye redness, fever, itching, nausea or weakness. She has tried nothing for the symptoms.  Thyroid Problem Presents for initial visit. Symptoms include diaphoresis, fatigue, heat intolerance, tremors, visual change and weight gain. Patient reports no anxiety, cold intolerance, constipation, depressed mood, diarrhea, dry skin, hair loss, nail problem, palpitations or weight loss.    Lab Results  Component Value Date   NA 140 08/05/2017   K 4.3 08/05/2017   CO2 18 (L) 08/05/2017   GLUCOSE 73 08/05/2017   BUN 14 08/05/2017   CREATININE 0.83 08/05/2017   CALCIUM 9.3 08/05/2017   GFRNONAA 96 08/05/2017   No results found for: "CHOL", "HDL", "LDLCALC", "LDLDIRECT", "TRIG", "CHOLHDL" Lab Results  Component Value Date   TSH 0.608 08/05/2017   No results found for: "HGBA1C" Lab Results  Component Value Date   WBC 7.1 08/05/2017   HGB 12.7 08/05/2017   HCT 38.0 08/05/2017   MCV 94 08/05/2017   PLT 199 08/05/2017   Lab Results  Component Value Date   ALT 16 08/05/2017   AST 16 08/05/2017   ALKPHOS 50 08/05/2017   BILITOT 0.8 08/05/2017   No results found for: "25OHVITD2", "25OHVITD3", "VD25OH"   Review of Systems  Constitutional:  Positive for diaphoresis, fatigue and weight gain. Negative for fever, unexpected weight change and weight loss.  Eyes:  Positive for blurred vision and photophobia. Negative for double vision, redness and  itching.  Respiratory:  Negative for chest tightness, shortness of breath and wheezing.   Cardiovascular:  Negative for chest pain and palpitations.  Gastrointestinal:  Negative for abdominal pain, constipation, diarrhea and nausea.  Endocrine: Positive for heat intolerance. Negative for cold intolerance.  Genitourinary:  Negative for vaginal bleeding.  Neurological:  Positive for tremors. Negative for facial asymmetry and weakness.  Psychiatric/Behavioral:  The patient is not nervous/anxious.     Patient Active Problem List   Diagnosis Date Noted   Indication for care in labor and delivery, antepartum 12/13/2014   Back pain affecting pregnancy 12/11/2014   Abdominal pain 12/10/2014   Pregnancy 11/27/2014   Encounter for suspected PROM, with rupture of membranes not found 11/27/2014   Indication for care or intervention related to labor and delivery 11/18/2014    No Known Allergies  No past surgical history on file.  Social History   Tobacco Use   Smoking status: Never   Smokeless tobacco: Never  Vaping Use   Vaping Use: Never used  Substance Use Topics   Alcohol use: No   Drug use: No     Medication list has been reviewed and updated.  Current Meds  Medication Sig   levonorgestrel (MIRENA) 20 MCG/DAY IUD by Intrauterine route.       05/27/2020    9:32 AM  GAD 7 : Generalized Anxiety Score  Nervous, Anxious, on Edge 0  Control/stop worrying 0  Worry too much - different things 0  Trouble relaxing 0  Restless 0  Easily annoyed or irritable 0  Afraid - awful might happen 0  Total GAD 7 Score 0       04/15/2022    2:59 PM 05/27/2020    9:32 AM 08/05/2017    2:32 PM  Depression screen PHQ 2/9  Decreased Interest 0 0 0  Down, Depressed, Hopeless 0 0 0  PHQ - 2 Score 0 0 0  Altered sleeping  0 3  Tired, decreased energy  0 3  Change in appetite  0 1  Feeling bad or failure about yourself   0 0  Trouble concentrating  0 0  Moving slowly or fidgety/restless   0 0  Suicidal thoughts  0 0  PHQ-9 Score  0 7  Difficult doing work/chores   Very difficult    BP Readings from Last 3 Encounters:  06/14/22 90/60  01/27/22 114/65  04/22/21 102/71    Physical Exam Vitals and nursing note reviewed.  Constitutional:      Appearance: She is well-developed.  HENT:     Head: Normocephalic.     Right Ear: External ear normal.     Left Ear: External ear normal.     Mouth/Throat:     Mouth: Mucous membranes are dry.  Eyes:     General: Lids are everted, no foreign bodies appreciated. No scleral icterus.       Left eye: No foreign body or hordeolum.     Extraocular Movements: Extraocular movements intact.     Conjunctiva/sclera: Conjunctivae normal.     Right eye: Right conjunctiva is not injected.     Left eye: Left conjunctiva is not injected.     Pupils: Pupils are equal, round, and reactive to light.  Neck:     Thyroid: No thyromegaly.     Vascular: No JVD.     Trachea: No tracheal deviation.  Cardiovascular:     Rate and Rhythm: Normal rate and regular rhythm.     Heart sounds: Normal heart sounds. No murmur heard.    No friction rub. No gallop.  Pulmonary:     Effort: Pulmonary effort is normal. No respiratory distress.     Breath sounds: Normal breath sounds. No wheezing or rales.  Abdominal:     General: Bowel sounds are normal.     Palpations: Abdomen is soft. There is no mass.     Tenderness: There is no abdominal tenderness. There is no guarding or rebound.  Musculoskeletal:        General: No tenderness. Normal range of motion.     Cervical back: Normal range of motion and neck supple.  Lymphadenopathy:     Cervical: No cervical adenopathy.  Skin:    General: Skin is warm.     Findings: No rash.  Neurological:     Mental Status: She is alert.     Cranial Nerves: Cranial nerves 2-12 are intact. No cranial nerve deficit.     Sensory: Sensation is intact.     Motor: Motor function is intact.     Deep Tendon Reflexes:  Reflexes normal.     Reflex Scores:      Bicep reflexes are 2+ on the right side and 2+ on the left side.      Patellar reflexes are 2+ on the right side and 2+ on the left side. Psychiatric:        Mood and Affect: Mood is not anxious or depressed.     Wt Readings from Last 3 Encounters:  06/14/22 162 lb (73.5 kg)  04/15/22 161 lb (73 kg)  01/27/22 141 lb 1.5 oz (64 kg)    BP 90/60   Pulse 72   Ht 5\' 3"  (1.6 m)   Wt 162 lb (73.5 kg)   SpO2 98%   BMI 28.70 kg/m   Assessment and Plan: 1. Fatigue, unspecified type New onset.  Persistent.  Episodic.  Stable.  This is associated also with decreased acuity of the vision when working with computer screen over periods of time.  This sometimes is associated with a headache.  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  In the meantime we will evaluate the concern for diabetes which is actually more of a concern for fatigue.  Will obtain a renal function panel CBC thyroid panel with TSH as well as an A1c for evaluation. - Renal Function Panel - CBC with Differential/Platelet - Thyroid Panel With TSH - Hemoglobin A1c     , MD

## 2022-06-14 NOTE — Patient Instructions (Signed)

## 2022-06-15 LAB — THYROID PANEL WITH TSH
Free Thyroxine Index: 2.1 (ref 1.2–4.9)
T3 Uptake Ratio: 29 % (ref 24–39)
T4, Total: 7.3 ug/dL (ref 4.5–12.0)
TSH: 1.04 u[IU]/mL (ref 0.450–4.500)

## 2022-06-15 LAB — CBC WITH DIFFERENTIAL/PLATELET
Basophils Absolute: 0 10*3/uL (ref 0.0–0.2)
Basos: 0 %
EOS (ABSOLUTE): 0.1 10*3/uL (ref 0.0–0.4)
Eos: 1 %
Hematocrit: 39 % (ref 34.0–46.6)
Hemoglobin: 13.3 g/dL (ref 11.1–15.9)
Immature Grans (Abs): 0 10*3/uL (ref 0.0–0.1)
Immature Granulocytes: 0 %
Lymphocytes Absolute: 2.1 10*3/uL (ref 0.7–3.1)
Lymphs: 29 %
MCH: 30.9 pg (ref 26.6–33.0)
MCHC: 34.1 g/dL (ref 31.5–35.7)
MCV: 91 fL (ref 79–97)
Monocytes Absolute: 0.6 10*3/uL (ref 0.1–0.9)
Monocytes: 8 %
Neutrophils Absolute: 4.5 10*3/uL (ref 1.4–7.0)
Neutrophils: 62 %
Platelets: 232 10*3/uL (ref 150–450)
RBC: 4.3 x10E6/uL (ref 3.77–5.28)
RDW: 12.1 % (ref 11.7–15.4)
WBC: 7.3 10*3/uL (ref 3.4–10.8)

## 2022-06-15 LAB — HEMOGLOBIN A1C
Est. average glucose Bld gHb Est-mCnc: 123 mg/dL
Hgb A1c MFr Bld: 5.9 % — ABNORMAL HIGH (ref 4.8–5.6)

## 2022-06-15 LAB — RENAL FUNCTION PANEL
Albumin: 4.7 g/dL (ref 3.9–4.9)
BUN/Creatinine Ratio: 13 (ref 9–23)
BUN: 12 mg/dL (ref 6–20)
CO2: 21 mmol/L (ref 20–29)
Calcium: 9.6 mg/dL (ref 8.7–10.2)
Chloride: 104 mmol/L (ref 96–106)
Creatinine, Ser: 0.89 mg/dL (ref 0.57–1.00)
Glucose: 95 mg/dL (ref 70–99)
Phosphorus: 3.9 mg/dL (ref 3.0–4.3)
Potassium: 4 mmol/L (ref 3.5–5.2)
Sodium: 140 mmol/L (ref 134–144)
eGFR: 88 mL/min/{1.73_m2} (ref >59)

## 2022-07-29 ENCOUNTER — Encounter: Payer: Self-pay | Admitting: Emergency Medicine

## 2022-07-29 ENCOUNTER — Ambulatory Visit
Admission: EM | Admit: 2022-07-29 | Discharge: 2022-07-29 | Disposition: A | Discharge status: Home / Self Care | Payer: BLUE CROSS/BLUE SHIELD | Attending: Physician Assistant | Admitting: Physician Assistant

## 2022-07-29 DIAGNOSIS — N760 Acute vaginitis: Secondary | ICD-10-CM | POA: Diagnosis not present

## 2022-07-29 DIAGNOSIS — R110 Nausea: Secondary | ICD-10-CM | POA: Diagnosis present

## 2022-07-29 DIAGNOSIS — R102 Pelvic and perineal pain: Secondary | ICD-10-CM | POA: Diagnosis present

## 2022-07-29 LAB — CBC WITH DIFFERENTIAL/PLATELET
Abs Immature Granulocytes: 0.03 10*3/uL (ref 0.00–0.07)
Basophils Absolute: 0 10*3/uL (ref 0.0–0.1)
Basophils Relative: 1 %
Eosinophils Absolute: 0.1 10*3/uL (ref 0.0–0.5)
Eosinophils Relative: 1 %
HCT: 37 % (ref 36.0–46.0)
Hemoglobin: 12.7 g/dL (ref 12.0–15.0)
Immature Granulocytes: 0 %
Lymphocytes Relative: 25 %
Lymphs Abs: 2.1 10*3/uL (ref 0.7–4.0)
MCH: 31.4 pg (ref 26.0–34.0)
MCHC: 34.3 g/dL (ref 30.0–36.0)
MCV: 91.6 fL (ref 80.0–100.0)
Monocytes Absolute: 0.7 10*3/uL (ref 0.1–1.0)
Monocytes Relative: 8 %
Neutro Abs: 5.4 10*3/uL (ref 1.7–7.7)
Neutrophils Relative %: 65 %
Platelets: 225 10*3/uL (ref 150–400)
RBC: 4.04 MIL/uL (ref 3.87–5.11)
RDW: 13.1 % (ref 11.5–15.5)
WBC: 8.4 10*3/uL (ref 4.0–10.5)
nRBC: 0 % (ref 0.0–0.2)

## 2022-07-29 LAB — URINALYSIS, ROUTINE W REFLEX MICROSCOPIC
Bilirubin Urine: NEGATIVE
Glucose, UA: NEGATIVE mg/dL
Nitrite: NEGATIVE
Protein, ur: NEGATIVE mg/dL
Specific Gravity, Urine: 1.025 (ref 1.005–1.030)
pH: 5.5 (ref 5.0–8.0)

## 2022-07-29 LAB — COMPREHENSIVE METABOLIC PANEL
ALT: 16 U/L (ref 0–44)
AST: 17 U/L (ref 15–41)
Albumin: 4.3 g/dL (ref 3.5–5.0)
Alkaline Phosphatase: 42 U/L (ref 38–126)
Anion gap: 6 (ref 5–15)
BUN: 12 mg/dL (ref 6–20)
CO2: 25 mmol/L (ref 22–32)
Calcium: 8.7 mg/dL — ABNORMAL LOW (ref 8.9–10.3)
Chloride: 103 mmol/L (ref 98–111)
Creatinine, Ser: 0.72 mg/dL (ref 0.44–1.00)
GFR, Estimated: >60 mL/min (ref >60)
Glucose, Bld: 91 mg/dL (ref 70–99)
Potassium: 3.9 mmol/L (ref 3.5–5.1)
Sodium: 134 mmol/L — ABNORMAL LOW (ref 135–145)
Total Bilirubin: 0.9 mg/dL (ref 0.3–1.2)
Total Protein: 7.5 g/dL (ref 6.5–8.1)

## 2022-07-29 LAB — WET PREP, GENITAL
Sperm: NONE SEEN
Trich, Wet Prep: NONE SEEN
WBC, Wet Prep HPF POC: <10 (ref <10)

## 2022-07-29 LAB — URINALYSIS, MICROSCOPIC (REFLEX)

## 2022-07-29 LAB — PREGNANCY, URINE: Preg Test, Ur: NEGATIVE

## 2022-07-29 MED ORDER — METRONIDAZOLE 500 MG PO TABS: 500.0000 mg | ORAL_TABLET | Freq: Two times a day (BID) | ORAL | 0 refills | Status: AC

## 2022-07-29 MED ORDER — ONDANSETRON 4 MG PO TBDP: 4.0000 mg | ORAL_TABLET | Freq: Four times a day (QID) | ORAL | 0 refills | Status: AC | PRN

## 2022-07-29 MED ORDER — ONDANSETRON 4 MG PO TBDP
4.0000 mg | ORAL_TABLET | Freq: Once | ORAL | Status: AC
  Administered 2022-07-29: 4 mg via ORAL

## 2022-07-29 MED ORDER — FLUCONAZOLE 150 MG PO TABS: ORAL_TABLET | ORAL | 0 refills | Status: DC

## 2022-07-29 NOTE — ED Triage Notes (Signed)
Pt c/o lower abdominal pain and nausea. Started about 2 days ago. Denies fever, diarrhea, UTI symptoms, or vaginal discharge.

## 2022-07-29 NOTE — Discharge Instructions (Addendum)
The most common types of vaginal infections are yeast infections and bacterial vaginosis. Neither of which are really considered to be sexually transmitted. Often a pH swab or wet prep is performed and if abnormal may reveal either type of infection. Begin metronidazole if prescribed for possible BV infection. If there is concern for yeast infection, fluconazole is often prescribed . Take this as directed. You may also apply topical miconazole (can be purchased OTC) externally for relief of itching. Increase rest and fluid intake. If labs sent out, we will call within 2-5 days with results and amend treatment if necessary. Always try to use pH balanced washes/wipes, urinate after intercourse, stay hydrated, and take probiotics if you are prone to vaginal infections. Return or see PCP or gynecologist for new/worsening infections.    POSSIBLE UTI. WE WILL CALL IF YOU NEED ADDITIONAL ANTIBIOTICS. IF YOU HAVE PAINFUL URINATION, FREQUENCY/URGENCY CALL us AND WE MAY SEND SOMETHING SOONER.   IF FEVER OR WORSENING ABDOMINAL PAIN, GO TO ER.

## 2022-07-29 NOTE — ED Provider Notes (Signed)
[name]    CSN: [id number] Arrival date & time: 07/29/22  1245      History   Chief Complaint Chief Complaint  Patient presents with   Abdominal Pain    HPI Tracey Hood is a 32 y.o. female presenting for onset of nausea without vomiting and lower abdominal cramping discomfort yesterday.  She also reports reduced appetite and reduced food intake.  She has not had any fevers, fatigue, bodies, cough, congestion, sore throat, chest pain, shortness of breath.  Denies any diarrhea.  Had a normal BM today.  No dysuria, frequency or urgency.  Denies any abnormal vaginal discharge, itching or odor and no concern for STIs.  Reports she has an IUD and has low concern for pregnancy.  Denies any sick contacts.  She has not taken any OTC meds for symptoms.  She has never had symptoms like this before.  No history of GI problems.  No other complaints or concerns.  HPI  History reviewed. No pertinent past medical history.  Patient Active Problem List   Diagnosis Date Noted   Indication for care in labor and delivery, antepartum 12/13/2014   Back pain affecting pregnancy 12/11/2014   Abdominal pain 12/10/2014   Pregnancy 11/27/2014   Encounter for suspected PROM, with rupture of membranes not found 11/27/2014   Indication for care or intervention related to labor and delivery 11/18/2014    History reviewed. No pertinent surgical history.  OB History     Gravida  2   Para  2   Term  2   Preterm  0   AB  0   Living  1      SAB  0   IAB  0   Ectopic  0   Multiple  0   Live Births  1            Home Medications    Prior to Admission medications   Medication Sig Start Date End Date Taking? Authorizing Provider  fluconazole (DIFLUCAN) 150 MG tablet Take 1 tab po q72h 07/29/22  Yes Shirlee Latch, PA-C  levonorgestrel (MIRENA) 20 MCG/DAY IUD by Intrauterine route.   Yes [provider]  metroNIDAZOLE (FLAGYL) 500 MG tablet Take 1  tablet (500 mg total) by mouth 2 (two) times daily for 7 days. 07/29/22 08/05/22 Yes Eusebio Friendly B, PA-C  ondansetron (ZOFRAN-ODT) 4 MG disintegrating tablet Take 1 tablet (4 mg total) by mouth every 6 (six) hours as needed for up to 3 days for nausea or vomiting. 07/29/22 08/01/22 Yes Shirlee Latch, PA-C    Family History Family History  Problem Relation Age of Onset   Hypertension Father    Alcohol abuse Neg Hx    Arthritis Neg Hx    Birth defects Neg Hx    Cancer Neg Hx    Drug abuse Neg Hx    Asthma Neg Hx    Diabetes Neg Hx    COPD Neg Hx    Depression Neg Hx    Early death Neg Hx    Hearing loss Neg Hx    Heart disease Neg Hx    Hyperlipidemia Neg Hx    Kidney disease Neg Hx    Learning disabilities Neg Hx    Mental illness Neg Hx    Mental retardation Neg Hx    Miscarriages / Stillbirths Neg Hx    Stroke Neg Hx    Vision loss Neg Hx    Varicose Veins Neg Hx  Social History Social History   Tobacco Use   Smoking status: Never   Smokeless tobacco: Never  Vaping Use   Vaping Use: Never used  Substance Use Topics   Alcohol use: No   Drug use: No     Allergies   Patient has no known allergies.   Review of Systems Review of Systems  Constitutional:  Positive for appetite change. Negative for chills, diaphoresis, fatigue and fever.  HENT:  Negative for congestion, ear pain, rhinorrhea and sore throat.   Respiratory:  Negative for cough and shortness of breath.   Cardiovascular:  Negative for chest pain.  Gastrointestinal:  Positive for abdominal pain and nausea. Negative for constipation, diarrhea and vomiting.  Genitourinary:  Positive for pelvic pain. Negative for dysuria, frequency and vaginal discharge.  Musculoskeletal:  Negative for arthralgias and myalgias.  Skin:  Negative for rash.  Neurological:  Negative for weakness and headaches.  Hematological:  Negative for adenopathy.     Physical Exam Triage Vital Signs ED Triage Vitals  Enc  Vitals Group     BP      Pulse      Resp      Temp      Temp src      SpO2      Weight      Height      Head Circumference      Peak Flow      Pain Score      Pain Loc      Pain Edu?      Excl. in [place]?    No data found.  Updated Vital Signs BP 116/78 (BP Location: Left Arm)   Pulse 72   Temp 98.4 F (36.9 C) (Oral)   Resp 16   Ht 5\' 3"  (1.6 m)   Wt 162 lb 0.6 oz (73.5 kg)   SpO2 98%   BMI 28.70 kg/m      Physical Exam Vitals and nursing note reviewed.  Constitutional:      General: She is not in acute distress.    Appearance: Normal appearance. She is well-developed. She is not ill-appearing or toxic-appearing.  HENT:     Head: Normocephalic and atraumatic.     Nose: Nose normal.     Mouth/Throat:     Mouth: Mucous membranes are moist.     Pharynx: Oropharynx is clear.  Eyes:     General: No scleral icterus.       Right eye: No discharge.        Left eye: No discharge.     Conjunctiva/sclera: Conjunctivae normal.  Cardiovascular:     Rate and Rhythm: Normal rate and regular rhythm.     Heart sounds: Normal heart sounds.  Pulmonary:     Effort: Pulmonary effort is normal. No respiratory distress.     Breath sounds: Normal breath sounds.  Abdominal:     Palpations: Abdomen is soft.     Tenderness: There is abdominal tenderness (RLQ and suprapubic). There is no right CVA tenderness or left CVA tenderness.  Musculoskeletal:     Cervical back: Neck supple.  Skin:    General: Skin is dry.  Neurological:     General: No focal deficit present.     Mental Status: She is alert. Mental status is at baseline.     Motor: No weakness.     Gait: Gait normal.  Psychiatric:        Mood and Affect: Mood normal.  Behavior: Behavior normal.        Thought Content: Thought content normal.      UC Treatments / Results  Labs (all labs ordered are listed, but only abnormal results are displayed) Labs Reviewed  WET PREP, GENITAL - Abnormal; Notable for the  following components:      Result Value   Yeast Wet Prep HPF POC PRESENT (*)    Clue Cells Wet Prep HPF POC PRESENT (*)    All other components within normal limits  URINALYSIS, ROUTINE W REFLEX MICROSCOPIC - Abnormal; Notable for the following components:   APPearance CLOUDY (*)    Hgb urine dipstick TRACE (*)    Ketones, ur TRACE (*)    Leukocytes,Ua TRACE (*)    All other components within normal limits  URINALYSIS, MICROSCOPIC (REFLEX) - Abnormal; Notable for the following components:   Bacteria, UA MANY (*)    All other components within normal limits  COMPREHENSIVE METABOLIC PANEL - Abnormal; Notable for the following components:   Sodium 134 (*)    Calcium 8.7 (*)    All other components within normal limits  URINE CULTURE  PREGNANCY, URINE  CBC WITH DIFFERENTIAL/PLATELET    EKG   Radiology No results found.  Procedures Procedures (including critical care time)  Medications Ordered in UC Medications  ondansetron (ZOFRAN-ODT) disintegrating tablet 4 mg (4 mg Oral Given 07/29/22 1345)    Initial Impression / Assessment and Plan / UC Course  I have reviewed the triage vital signs and the nursing notes.  Pertinent labs & imaging results that were available during my care of the patient were reviewed by me and considered in my medical decision making (see chart for details).   32 year old female presents for lower abdominal pain and nausea since yesterday.  No fever.  Denies URI symptoms, urinary symptoms or changes in BMs.  No sick contacts.  Vitals normal and stable and patient is overall well-appearing.  She is in no acute distress.  On exam today normal HEENT exam.  Chest clear to auscultation heart regular rate and rhythm.  Abdomen soft with tenderness palpation of the suprapubic region and right lower quadrant.  Urine pregnancy negative.   Urinalysis shows trace leuks and many bacteria. Will send culture.   Patient given 4 mg ODT Zofran for nausea.  CBC, CMP  and wet prep ordered. Patient elects to forego pelvic exam and provides self swab.  Wet prep shows clue cells and yeast.  Will treat accordingly with metronidazole and Diflucan.  Explained to patient that acute vaginitis can cause pelvic pain and that may be the reason for her symptoms.  She reports relief of her nausea with Zofran.  We discussed trying Midol or Tylenol for pain relief and using a heating pad.  Advised symptoms should be better after couple days.  Advised that if her urine culture is positive we will send additional antibiotics but will hold off at this time as I am not sure if she presented a clean-catch.  Advised if fever or worsening abdominal pain to go to emergency department.  CBC and CMP are essentially normal and overall reassuring.  Low suspicion for acute abdominal or pelvic process requiring emergency follow-up at this time.   Final Clinical Impressions(s) / UC Diagnoses   Final diagnoses:  Pelvic pain in female  Acute vaginitis  Nausea without vomiting     Discharge Instructions      The most common types of vaginal infections are yeast infections and bacterial vaginosis.  Neither of which are really considered to be sexually transmitted. Often a pH swab or wet prep is performed and if abnormal may reveal either type of infection. Begin metronidazole if prescribed for possible BV infection. If there is concern for yeast infection, fluconazole is often prescribed . Take this as directed. You may also apply topical miconazole (can be purchased OTC) externally for relief of itching. Increase rest and fluid intake. If labs sent out, we will call within 2-5 days with results and amend treatment if necessary. Always try to use pH balanced washes/wipes, urinate after intercourse, stay hydrated, and take probiotics if you are prone to vaginal infections. Return or see PCP or gynecologist for new/worsening infections.    POSSIBLE UTI. WE WILL CALL IF YOU NEED ADDITIONAL  ANTIBIOTICS. IF YOU HAVE PAINFUL URINATION, FREQUENCY/URGENCY CALL us AND WE MAY SEND SOMETHING SOONER.   IF FEVER OR WORSENING ABDOMINAL PAIN, GO TO ER.     ED Prescriptions     Medication Sig Dispense Auth. Provider   metroNIDAZOLE (FLAGYL) 500 MG tablet Take 1 tablet (500 mg total) by mouth 2 (two) times daily for 7 days. 14 tablet Laurene Footman B, PA-C   fluconazole (DIFLUCAN) 150 MG tablet Take 1 tab po q72h 2 tablet Laurene Footman B, PA-C   ondansetron (ZOFRAN-ODT) 4 MG disintegrating tablet Take 1 tablet (4 mg total) by mouth every 6 (six) hours as needed for up to 3 days for nausea or vomiting. 12 tablet Gretta Cool      PDMP not reviewed this encounter.   Danton Clap, PA-C 07/29/22 1421

## 2022-07-30 LAB — URINE CULTURE: Culture: NO GROWTH

## 2022-12-10 ENCOUNTER — Ambulatory Visit
Admission: RE | Admit: 2022-12-10 | Discharge: 2022-12-10 | Disposition: A | Discharge status: Home / Self Care | Payer: BLUE CROSS/BLUE SHIELD | Source: Ambulatory Visit | Attending: Emergency Medicine | Admitting: Emergency Medicine

## 2022-12-10 VITALS — BP 127/78 | HR 83 | Temp 98.7°F | Resp 14 | Ht 63.0 in | Wt 165.0 lb

## 2022-12-10 DIAGNOSIS — N764 Abscess of vulva: Secondary | ICD-10-CM | POA: Diagnosis not present

## 2022-12-10 MED ORDER — CEPHALEXIN 500 MG PO CAPS: 500.0000 mg | ORAL_CAPSULE | Freq: Two times a day (BID) | ORAL | 0 refills | Status: AC

## 2022-12-10 NOTE — ED Triage Notes (Signed)
Patient states that she shaved her pubic area and noticed a small bump but has gotten redder and more swollen.  Patient states that when she lays down on her stomach, she starts to feel dizzy and her toes and fingers go numb.  Patient states that it lasts about 15 seconds.  Patient reports this has been going on for years.

## 2022-12-10 NOTE — ED Provider Notes (Signed)
[name]    CSN: [id number] Arrival date & time: 12/10/22  1349      History   Chief Complaint Chief Complaint  Patient presents with   Blister    Vaginal cyst or hair bump - Entered by patient    HPI Tracey Hood is a 32 y.o. female.   Patient presents for evaluation of a cyst to the labia present for 7 days.  First began after shaving.  Over the past week area has become enlarged, reddened and painful.  Worsened in severity this morning.  Patient able to drain at home.  Denies fevers.      History reviewed. No pertinent past medical history.  Patient Active Problem List   Diagnosis Date Noted   Indication for care in labor and delivery, antepartum 12/13/2014   Back pain affecting pregnancy 12/11/2014   Abdominal pain 12/10/2014   Pregnancy 11/27/2014   Encounter for suspected PROM, with rupture of membranes not found 11/27/2014   Indication for care or intervention related to labor and delivery 11/18/2014    History reviewed. No pertinent surgical history.  OB History     Gravida  2   Para  2   Term  2   Preterm  0   AB  0   Living  1      SAB  0   IAB  0   Ectopic  0   Multiple  0   Live Births  1            Home Medications    Prior to Admission medications   Medication Sig Start Date End Date Taking? Authorizing Provider  fluconazole (DIFLUCAN) 150 MG tablet Take 1 tab po q72h 07/29/22   Shirlee Latch, PA-C  levonorgestrel (MIRENA) 20 MCG/DAY IUD by Intrauterine route.    [provider]    Family History Family History  Problem Relation Age of Onset   Hypertension Father    Alcohol abuse Neg Hx    Arthritis Neg Hx    Birth defects Neg Hx    Cancer Neg Hx    Drug abuse Neg Hx    Asthma Neg Hx    Diabetes Neg Hx    COPD Neg Hx    Depression Neg Hx    Early death Neg Hx    Hearing loss Neg Hx    Heart disease Neg Hx    Hyperlipidemia Neg Hx    Kidney disease Neg Hx    Learning  disabilities Neg Hx    Mental illness Neg Hx    Mental retardation Neg Hx    Miscarriages / Stillbirths Neg Hx    Stroke Neg Hx    Vision loss Neg Hx    Varicose Veins Neg Hx     Social History Social History   Tobacco Use   Smoking status: Never   Smokeless tobacco: Never  Vaping Use   Vaping Use: Never used  Substance Use Topics   Alcohol use: No   Drug use: No     Allergies   Patient has no known allergies.   Review of Systems Review of Systems   Physical Exam Triage Vital Signs ED Triage Vitals  Enc Vitals Group     BP 12/10/22 1402 127/78     Pulse Rate 12/10/22 1402 83     Resp 12/10/22 1402 14     Temp 12/10/22 1402 98.7 F (37.1 C)     Temp Source 12/10/22 1402  Oral     SpO2 12/10/22 1402 97 %     Weight 12/10/22 1400 165 lb (74.8 kg)     Height 12/10/22 1400 5\' 3"  (1.6 m)     Head Circumference --      Peak Flow --      Pain Score 12/10/22 1359 4     Pain Loc --      Pain Edu? --      Excl. in [place]? --    No data found.  Updated Vital Signs BP 127/78 (BP Location: Right Arm)   Pulse 83   Temp 98.7 F (37.1 C) (Oral)   Resp 14   Ht 5\' 3"  (1.6 m)   Wt 165 lb (74.8 kg)   SpO2 97%   BMI 29.23 kg/m   Visual Acuity Right Eye Distance:   Left Eye Distance:   Bilateral Distance:    Right Eye Near:   Left Eye Near:    Bilateral Near:     Physical Exam Constitutional:      Appearance: Normal appearance.  Eyes:     Extraocular Movements: Extraocular movements intact.  Pulmonary:     Effort: Pulmonary effort is normal.  Skin:    Comments: 0.5 x 0.5 cm abscess present to the right labia  Neurological:     Mental Status: She is alert and oriented to person, place, and time. Mental status is at baseline.      UC Treatments / Results  Labs (all labs ordered are listed, but only abnormal results are displayed) Labs Reviewed - No data to display  EKG   Radiology No results found.  Procedures Procedures (including critical care  time)  Medications Ordered in UC Medications - No data to display  Initial Impression / Assessment and Plan / UC Course  I have reviewed the triage vital signs and the nursing notes.  Pertinent labs & imaging results that were available during my care of the patient were reviewed by me and considered in my medical decision making (see chart for details).  Cyst of the right labia  Site has been drained by patient therefore will defer I&D today, placed on Keflex prophylactically and recommended warm compresses to help facilitate drainage further, discussed this exfoliation as a preventative measure, may follow-up with his urgent care as needed if symptoms persist or worsen Final Clinical Impressions(s) / UC Diagnoses   Final diagnoses:  None   Discharge Instructions   None    ED Prescriptions   None    PDMP not reviewed this encounter.   Valinda Hoar, NP 12/10/22 1424

## 2022-12-10 NOTE — Discharge Instructions (Signed)
You were evaluated for an abscess to your labia most likely caused by the ingrown hair  Take Keflex every morning and every evening for 5 days to clear any germs that may be aiding to symptoms  Hold warm-hot compresses to affected area at least 4 times a day, this helps to facilitate draining, the more the better  Please return for evaluation for increased swelling, increased tenderness or pain, non healing site, non draining site, you begin to have fever or chills   Prior to and when she began to see hair regrowth exfoliate with a cheap rough textured washcloth making circular motions, this helps of her hair to grow outwards  We reviewed the etiology of recurrent abscesses of skin.  Skin abscesses are collections of pus within the dermis and deeper skin tissues. Skin abscesses manifest as painful, tender, fluctuant, and erythematous nodules, frequently surmounted by a pustule and surrounded by a rim of erythematous swelling.  Spontaneous drainage of purulent material may occur.  Fever can occur on occasion.    -Skin abscesses can develop in healthy individuals with no predisposing conditions other than skin or nasal carriage of Staphylococcus aureus.  Individuals in close contact with others who have active infection with skin abscesses are at increased risk which is likely to explain why twin brother has similar episodes.   In addition, any process leading to a breach in the skin barrier can also predispose to the development of a skin abscesses, such as atopic dermatitis.

## 2023-02-23 ENCOUNTER — Ambulatory Visit
Admission: EM | Admit: 2023-02-23 | Discharge: 2023-02-23 | Disposition: A | Discharge status: Home / Self Care | Payer: BLUE CROSS/BLUE SHIELD | Attending: Physician Assistant | Admitting: Physician Assistant

## 2023-02-23 DIAGNOSIS — U071 COVID-19: Secondary | ICD-10-CM | POA: Insufficient documentation

## 2023-02-23 DIAGNOSIS — N898 Other specified noninflammatory disorders of vagina: Secondary | ICD-10-CM | POA: Insufficient documentation

## 2023-02-23 DIAGNOSIS — R051 Acute cough: Secondary | ICD-10-CM | POA: Insufficient documentation

## 2023-02-23 DIAGNOSIS — Z113 Encounter for screening for infections with a predominantly sexual mode of transmission: Secondary | ICD-10-CM | POA: Insufficient documentation

## 2023-02-23 LAB — WET PREP, GENITAL
Clue Cells Wet Prep HPF POC: NONE SEEN
Sperm: NONE SEEN
Trich, Wet Prep: NONE SEEN
WBC, Wet Prep HPF POC: <10 — AB (ref <10)
Yeast Wet Prep HPF POC: NONE SEEN

## 2023-02-23 LAB — SARS CORONAVIRUS 2 BY RT PCR: SARS Coronavirus 2 by RT PCR: POSITIVE — AB

## 2023-02-23 LAB — GROUP A STREP BY PCR: Group A Strep by PCR: NOT DETECTED

## 2023-02-23 MED ORDER — PROMETHAZINE-DM 6.25-15 MG/5ML PO SYRP: 5.0000 mL | ORAL_SOLUTION | Freq: Four times a day (QID) | ORAL | 0 refills | Status: DC | PRN

## 2023-02-23 NOTE — ED Provider Notes (Signed)
[name]    CSN: [id number] Arrival date & time: 02/23/23  1314      History   Chief Complaint   HPI Tracey Hood is a 32 y.o. female presenting for multiple complaints.   She says she recently returned from a cruise. Reports 3 day history of cough, congestion, fatigue, and sore throat.  She has not had any fevers, body aches, chest pain, shortness of breath, vomiting, or diarrhea.  Denies any sick contacts.  She has taken decongestants for symptoms.   Patient also requesting STI testing.  Has had vaginal discharge (without odor) for a few days. Reports a new partner. No dysuria, frequency or urgency.  Denies significant concern for STIs, but would like a screening to include HIV and syphilis.  Reports she has an IUD and has low concern for pregnancy. Has used boric acid suppositories and cranberry juice and says it has helped.  No other complaints or concerns.  HPI  History reviewed. No pertinent past medical history.  Patient Active Problem List   Diagnosis Date Noted   Indication for care in labor and delivery, antepartum 12/13/2014   Back pain affecting pregnancy 12/11/2014   Abdominal pain 12/10/2014   Pregnancy 11/27/2014   Encounter for suspected PROM, with rupture of membranes not found 11/27/2014   Indication for care or intervention related to labor and delivery 11/18/2014    History reviewed. No pertinent surgical history.  OB History     Gravida  2   Para  2   Term  2   Preterm  0   AB  0   Living  1      SAB  0   IAB  0   Ectopic  0   Multiple  0   Live Births  1            Home Medications    Prior to Admission medications   Medication Sig Start Date End Date Taking? Authorizing Provider  levonorgestrel (MIRENA) 20 MCG/DAY IUD by Intrauterine route.   Yes [provider]  promethazine-dextromethorphan (PROMETHAZINE-DM) 6.25-15 MG/5ML syrup Take 5 mLs by mouth 4 (four) times daily as needed. 02/23/23   Yes Shirlee Latch, PA-C    Family History Family History  Problem Relation Age of Onset   Hypertension Father    Alcohol abuse Neg Hx    Arthritis Neg Hx    Birth defects Neg Hx    Cancer Neg Hx    Drug abuse Neg Hx    Asthma Neg Hx    Diabetes Neg Hx    COPD Neg Hx    Depression Neg Hx    Early death Neg Hx    Hearing loss Neg Hx    Heart disease Neg Hx    Hyperlipidemia Neg Hx    Kidney disease Neg Hx    Learning disabilities Neg Hx    Mental illness Neg Hx    Mental retardation Neg Hx    Miscarriages / Stillbirths Neg Hx    Stroke Neg Hx    Vision loss Neg Hx    Varicose Veins Neg Hx     Social History Social History   Tobacco Use   Smoking status: Never   Smokeless tobacco: Never  Vaping Use   Vaping status: Never Used  Substance Use Topics   Alcohol use: No   Drug use: No     Allergies   Patient has no known allergies.   Review of Systems  Review of Systems  Constitutional:  Positive for fatigue. Negative for appetite change, chills, diaphoresis and fever.  HENT:  Positive for congestion, rhinorrhea and sore throat. Negative for ear pain.   Respiratory:  Positive for cough. Negative for shortness of breath.   Cardiovascular:  Negative for chest pain.  Gastrointestinal:  Negative for abdominal pain, constipation, diarrhea, nausea and vomiting.  Genitourinary:  Positive for vaginal discharge. Negative for dysuria, frequency and pelvic pain.  Musculoskeletal:  Negative for arthralgias and myalgias.  Skin:  Negative for rash.  Neurological:  Negative for weakness and headaches.  Hematological:  Negative for adenopathy.     Physical Exam Triage Vital Signs ED Triage Vitals  Enc Vitals Group     BP      Pulse      Resp      Temp      Temp src      SpO2      Weight      Height      Head Circumference      Peak Flow      Pain Score      Pain Loc      Pain Edu?      Excl. in GC?    No data found.  Updated Vital Signs BP 126/70 (BP  Location: Right Arm)   Pulse 73   Temp 98.8 F (37.1 C) (Oral)   Resp 17   Wt 158 lb (71.7 kg)   SpO2 96%   BMI 27.99 kg/m      Physical Exam Vitals and nursing note reviewed.  Constitutional:      General: She is not in acute distress.    Appearance: Normal appearance. She is well-developed. She is not ill-appearing or toxic-appearing.  HENT:     Head: Normocephalic and atraumatic.     Nose: Congestion present.     Mouth/Throat:     Mouth: Mucous membranes are moist.     Pharynx: Oropharynx is clear. Posterior oropharyngeal erythema present.  Eyes:     General: No scleral icterus.       Right eye: No discharge.        Left eye: No discharge.     Conjunctiva/sclera: Conjunctivae normal.  Cardiovascular:     Rate and Rhythm: Normal rate and regular rhythm.     Heart sounds: Normal heart sounds.  Pulmonary:     Effort: Pulmonary effort is normal. No respiratory distress.     Breath sounds: Normal breath sounds.  Abdominal:     Palpations: Abdomen is soft.     Tenderness: There is no abdominal tenderness. There is no right CVA tenderness or left CVA tenderness.  Musculoskeletal:     Cervical back: Neck supple.  Skin:    General: Skin is dry.  Neurological:     General: No focal deficit present.     Mental Status: She is alert. Mental status is at baseline.     Motor: No weakness.     Gait: Gait normal.  Psychiatric:        Mood and Affect: Mood normal.        Behavior: Behavior normal.        Thought Content: Thought content normal.      UC Treatments / Results  Labs (all labs ordered are listed, but only abnormal results are displayed) Labs Reviewed  SARS CORONAVIRUS 2 BY RT PCR - Abnormal; Notable for the following components:      Result Value  SARS Coronavirus 2 by RT PCR POSITIVE (*)    All other components within normal limits  WET PREP, GENITAL - Abnormal; Notable for the following components:   WBC, Wet Prep HPF POC <10 (*)    All other components  within normal limits  GROUP A STREP BY PCR  HIV ANTIBODY (ROUTINE TESTING W REFLEX)  RPR  CERVICOVAGINAL ANCILLARY ONLY    EKG   Radiology No results found.  Procedures Procedures (including critical care time)  Medications Ordered in UC Medications - No data to display   Initial Impression / Assessment and Plan / UC Course  I have reviewed the triage vital signs and the nursing notes.  Pertinent labs & imaging results that were available during my care of the patient were reviewed by me and considered in my medical decision making (see chart for details).   32 year old female presents for URI symptoms x 3 days, vaginal discharge and requesting STI testing.  Vitals normal and stable and patient is overall well-appearing.  She is in no acute distress.  On exam today normal HEENT exam.  Chest clear to auscultation heart regular rate and rhythm.  Abdomen soft without tenderness to palpation.  Strep and COVID testing obtained.  Patient elects to forego pelvic exam and provides self swab for GC/chlamydia. Wet prep obtained. RPR and HIV ordered.  Wet prep is negative.  Pending STI screen.  Positive COVID.  Reviewed current CDC guidelines, isolation protocols and ED precautions.  Sent Promethazine DM to pharmacy.  An acute illness with systemic symptoms, screening exam and no other acute problem.  Final Clinical Impressions(s) / UC Diagnoses   Final diagnoses:  COVID-19  Acute cough  Vaginal discharge  Routine screening for STI (sexually transmitted infection)     Discharge Instructions      -Positive COVID.  Need to isolate until fever free 24 hours and symptoms improving.  Treatment is supportive. - Rest and cough medicine to the pharmacy for you.  Increase rest and fluids.  Should be feeling better within about a week. - Negative yeast and BV testing.  That could be because you have treated yourself.  Results of the STI screening will be back in the next couple of  days.  Results will come through MyChart.  If anything is positive we will contact you and recommend further treatment.  Hold off on sexual contact until you receive these results and then for 1 week after you and your partner have been treated if you are positive.      ED Prescriptions     Medication Sig Dispense Auth. Provider   promethazine-dextromethorphan (PROMETHAZINE-DM) 6.25-15 MG/5ML syrup Take 5 mLs by mouth 4 (four) times daily as needed. 118 mL Shirlee Latch, PA-C      PDMP not reviewed this encounter.      Shirlee Latch, PA-C 02/23/23 1438

## 2023-02-23 NOTE — Discharge Instructions (Signed)
-  Positive COVID.  Need to isolate until fever free 24 hours and symptoms improving.  Treatment is supportive. - Rest and cough medicine to the pharmacy for you.  Increase rest and fluids.  Should be feeling better within about a week. - Negative yeast and BV testing.  That could be because you have treated yourself.  Results of the STI screening will be back in the next couple of days.  Results will come through MyChart.  If anything is positive we will contact you and recommend further treatment.  Hold off on sexual contact until you receive these results and then for 1 week after you and your partner have been treated if you are positive.

## 2023-02-23 NOTE — ED Triage Notes (Signed)
Pt went on a cruise to the [place] got back Sunday. Started Sunday. Sx coughing-headache- no fever. Flonase to help.   Wants STD testing.   Vaginal discharge. White discharge with itching. Started Sunday No odor. New partner.

## 2023-05-27 ENCOUNTER — Ambulatory Visit
Admission: RE | Admit: 2023-05-27 | Discharge: 2023-05-27 | Disposition: A | Discharge status: Home / Self Care | Payer: BLUE CROSS/BLUE SHIELD | Source: Ambulatory Visit | Attending: Family Medicine | Admitting: Family Medicine

## 2023-05-27 ENCOUNTER — Encounter: Payer: Self-pay | Admitting: Family Medicine

## 2023-05-27 ENCOUNTER — Ambulatory Visit (INDEPENDENT_AMBULATORY_CARE_PROVIDER_SITE_OTHER): Payer: BLUE CROSS/BLUE SHIELD | Admitting: Family Medicine

## 2023-05-27 ENCOUNTER — Ambulatory Visit
Admission: RE | Admit: 2023-05-27 | Discharge: 2023-05-27 | Disposition: A | Discharge status: Home / Self Care | Payer: BLUE CROSS/BLUE SHIELD | Attending: Family Medicine | Admitting: Family Medicine

## 2023-05-27 VITALS — BP 112/74 | HR 76 | Ht 63.0 in | Wt 153.0 lb

## 2023-05-27 DIAGNOSIS — M25562 Pain in left knee: Secondary | ICD-10-CM

## 2023-05-27 DIAGNOSIS — G250 Essential tremor: Secondary | ICD-10-CM | POA: Diagnosis not present

## 2023-05-27 MED ORDER — PROPRANOLOL HCL 10 MG PO TABS: 10.0000 mg | ORAL_TABLET | Freq: Three times a day (TID) | ORAL | 1 refills | Status: DC

## 2023-05-27 MED ORDER — MELOXICAM 7.5 MG PO TABS: 7.5000 mg | ORAL_TABLET | Freq: Every day | ORAL | 0 refills | Status: DC

## 2023-05-27 NOTE — Progress Notes (Signed)
Date:  05/27/2023   Name:  Tracey Hood   DOB:  Feb 04, 1991   MRN:  [id number]   Chief Complaint: Knee Pain (Left knee pain. Started Saturday after a fall.Patient was walking down stairs when she fell at home.  Taking tylenol. Feels like something is "pulling in knee." Hurt to touch. ) and Tremors (Tremors in hands for years. Its getting worse. )  Knee Pain  The incident occurred 5 to 7 days ago. The incident occurred at home. The injury mechanism was a fall. The pain is present in the left knee. The quality of the pain is described as aching. The pain is at a severity of 5/10. The pain is moderate. Pertinent negatives include no inability to bear weight, loss of motion, loss of sensation, muscle weakness, numbness or tingling. The symptoms are aggravated by weight bearing. She has tried acetaminophen for the symptoms. The treatment provided moderate relief.  Neurologic Problem The patient's pertinent negatives include no altered mental status, clumsiness, focal sensory loss, focal weakness, loss of balance, slurred speech, visual change or weakness. Primary symptoms comment: temors. This is a chronic problem. The problem has been waxing and waning since onset. There was no focality noted. Pertinent negatives include no chest pain, dizziness, headaches, light-headedness, palpitations or shortness of breath. Past treatments include nothing.    Lab Results  Component Value Date   NA 134 (L) 07/29/2022   K 3.9 07/29/2022   CO2 25 07/29/2022   GLUCOSE 91 07/29/2022   BUN 12 07/29/2022   CREATININE 0.72 07/29/2022   CALCIUM 8.7 (L) 07/29/2022   EGFR 88 06/14/2022   GFRNONAA >60 07/29/2022   No results found for: "CHOL", "HDL", "LDLCALC", "LDLDIRECT", "TRIG", "CHOLHDL" Lab Results  Component Value Date   TSH 1.040 06/14/2022   Lab Results  Component Value Date   HGBA1C 5.9 (H) 06/14/2022   Lab Results  Component Value Date   WBC 8.4 07/29/2022   HGB 12.7 07/29/2022   HCT 37.0  07/29/2022   MCV 91.6 07/29/2022   PLT 225 07/29/2022   Lab Results  Component Value Date   ALT 16 07/29/2022   AST 17 07/29/2022   ALKPHOS 42 07/29/2022   BILITOT 0.9 07/29/2022   No results found for: "25OHVITD2", "25OHVITD3", "VD25OH"   Review of Systems  Constitutional:  Negative for unexpected weight change.  Eyes:  Negative for visual disturbance.  Respiratory:  Negative for chest tightness, shortness of breath and wheezing.   Cardiovascular:  Negative for chest pain and palpitations.  Gastrointestinal:  Negative for blood in stool.  Genitourinary:  Negative for hematuria.  Neurological:  Positive for tremors. Negative for dizziness, tingling, focal weakness, facial asymmetry, speech difficulty, weakness, light-headedness, numbness, headaches and loss of balance.    Patient Active Problem List   Diagnosis Date Noted   Indication for care in labor and delivery, antepartum 12/13/2014   Back pain affecting pregnancy 12/11/2014   Abdominal pain 12/10/2014   Pregnancy 11/27/2014   Encounter for suspected PROM, with rupture of membranes not found 11/27/2014   Indication for care or intervention related to labor and delivery 11/18/2014    No Known Allergies  History reviewed. No pertinent surgical history.  Social History   Tobacco Use   Smoking status: Never   Smokeless tobacco: Never  Vaping Use   Vaping status: Never Used  Substance Use Topics   Alcohol use: No   Drug use: No     Medication list has been reviewed and  updated.  Current Meds  Medication Sig   levonorgestrel (MIRENA) 20 MCG/DAY IUD by Intrauterine route.       05/27/2023    2:30 PM 05/27/2020    9:32 AM  GAD 7 : Generalized Anxiety Score  Nervous, Anxious, on Edge 0 0  Control/stop worrying 0 0  Worry too much - different things 0 0  Trouble relaxing 3 0  Restless 0 0  Easily annoyed or irritable 0 0  Afraid - awful might happen 0 0  Total GAD 7 Score 3 0  Anxiety Difficulty Not  difficult at all        05/27/2023    2:29 PM 04/15/2022    2:59 PM 05/27/2020    9:32 AM  Depression screen PHQ 2/9  Decreased Interest 3 0 0  Down, Depressed, Hopeless 0 0 0  PHQ - 2 Score 3 0 0  Altered sleeping 3  0  Tired, decreased energy 0  0  Change in appetite 0  0  Feeling bad or failure about yourself  0  0  Trouble concentrating 0  0  Moving slowly or fidgety/restless 0  0  Suicidal thoughts 0  0  PHQ-9 Score 6  0  Difficult doing work/chores Not difficult at all      BP Readings from Last 3 Encounters:  05/27/23 112/74  02/23/23 126/70  12/10/22 127/78    Physical Exam Vitals and nursing note reviewed. Exam conducted with a chaperone present.  Constitutional:      General: She is not in acute distress.    Appearance: She is not diaphoretic.  HENT:     Head: Normocephalic and atraumatic.     Right Ear: Tympanic membrane and external ear normal.     Left Ear: Tympanic membrane and external ear normal.     Nose: Nose normal. No congestion or rhinorrhea.     Mouth/Throat:     Mouth: Mucous membranes are moist.  Eyes:     General:        Right eye: No discharge.        Left eye: No discharge.     Conjunctiva/sclera: Conjunctivae normal.     Pupils: Pupils are equal, round, and reactive to light.  Neck:     Thyroid: No thyromegaly.     Vascular: No JVD.  Cardiovascular:     Rate and Rhythm: Normal rate and regular rhythm.     Heart sounds: Normal heart sounds. No murmur heard.    No friction rub. No gallop.  Pulmonary:     Effort: Pulmonary effort is normal.     Breath sounds: Normal breath sounds.  Abdominal:     General: Bowel sounds are normal.     Palpations: Abdomen is soft. There is no mass.     Tenderness: There is no abdominal tenderness. There is no guarding.  Musculoskeletal:        General: Normal range of motion.     Cervical back: Normal range of motion and neck supple.     Left knee: Bony tenderness present. No effusion. Normal range  of motion. Tenderness present over the medial joint line and lateral joint line. No LCL laxity or MCL laxity.    Comments: Tender prox tibia  Lymphadenopathy:     Cervical: No cervical adenopathy.  Skin:    General: Skin is warm and dry.  Neurological:     Mental Status: She is alert.     Deep Tendon Reflexes: Reflexes are normal  and symmetric.     Wt Readings from Last 3 Encounters:  05/27/23 153 lb (69.4 kg)  02/23/23 158 lb (71.7 kg)  12/10/22 165 lb (74.8 kg)    BP 112/74   Pulse 76   Ht 5\' 3"  (1.6 m)   Wt 153 lb (69.4 kg)   SpO2 97%   BMI 27.10 kg/m   Assessment and Plan: 1. Essential tremor Chronic.  Episodic.  This is a fine tremor the patient has had a long time with springing to her attention.  This is most likely essential tremor and I will have a trial of some propranolol that we will take 10 mg 3 times a day to see if it may help the intensity.  In the meantime we will refer to neurology and the the patient is interested in further diagnostics and we will rule out any other concerns that may be involved of a neurologic nature. - Ambulatory referral to Neurology - propranolol (INDERAL) 10 MG tablet; Take 1 tablet (10 mg total) by mouth 3 (three) times daily.  Dispense: 30 tablet; Refill: 1 0  2. Acute pain of left knee New onset.  Status post fall on Saturday.  Pain is noted on the tibial plateau with mild tenderness in the lateral and medial joint lines.  There is no effusion there is mild tenderness over the popliteal bursa.  There may be some mild swelling secondary to some internal bleeding but that is gradually resolving and patient has no loss of motion.  We will obtain an x-ray to make sure that there is no plateau fracture and we will have instructed patient to take meloxicam 7.5 mg once a day. - DG Knee Complete 4 Views Left     Elizabeth Sauer, MD

## 2023-05-27 NOTE — Patient Instructions (Signed)
Essential Tremor A tremor is trembling or shaking that a person cannot control. Most tremors affect the hands or arms. Tremors can also affect the head, vocal cords, legs, and other parts of the body. Essential tremor is a tremor without a known cause. Usually, it occurs while a person is trying to perform an action. It tends to get worse gradually as a person ages. What are the causes? The cause of this condition is not known, but it often runs in families. What increases the risk? You are more likely to develop this condition if: You have a family member with essential tremor. You are 40 years of age or older. What are the signs or symptoms? The main sign of a tremor is a rhythmic shaking of certain parts of your body that is uncontrolled and unintentional. You may: Have difficulty eating with a spoon or fork. Have difficulty writing. Nod your head up and down or side to side. Have a quivering voice. The shaking may: Get worse over time. Come and go. Be more noticeable on one side of your body. Get worse due to stress, tiredness (fatigue), caffeine, and extreme heat or cold. How is this diagnosed? This condition may be diagnosed based on: Your symptoms and medical history. A physical exam. There is no single test to diagnose an essential tremor. However, your health care provider may order tests to rule out other causes of your condition. These may include: Blood and urine tests. Imaging studies of your brain, such as a CT scan or MRI. How is this treated? Treatment for essential tremor depends on the severity of the condition. Mild tremors may not need treatment if they do not affect your day-to-day life. Severe tremors may need to be treated using one or more of the following options: Medicines. Injections of a substance called botulinum toxin. Procedures such as deep brain stimulation (DBS) implantation or MRI-guided ultrasound treatment. Lifestyle changes. Occupational or  physical therapy. Follow these instructions at home: Lifestyle  Do not use any products that contain nicotine or tobacco. These products include cigarettes, chewing tobacco, and vaping devices, such as e-cigarettes. If you need help quitting, ask your health care provider. Limit your caffeine intake as told by your health care provider. Try to get 8 hours of sleep each night. Find ways to manage your stress that fit your lifestyle and personality. Consider trying meditation or yoga. Try to anticipate stressful situations and allow extra time to manage them. If you are struggling emotionally with the effects of your tremor, consider working with a mental health provider. General instructions Take over-the-counter and prescription medicines only as told by your health care provider. Avoid extreme heat and extreme cold. Keep all follow-up visits. This is important. Visits may include physical therapy visits. Where to find more information National Institute of Neurological Disorders and Stroke: www.ninds.nih.gov Contact a health care provider if: You experience any changes in the location or intensity of your tremors. You start having a tremor after starting a new medicine. You have a tremor with other symptoms, such as: Numbness. Tingling. Pain. Weakness. Your tremor gets worse. Your tremor interferes with your daily life. You feel down, blue, or sad for at least 2 weeks in a row. Worrying about your tremor and what other people think about you interferes with your everyday life functions, including relationships, work, or school. Summary Essential tremor is a tremor without a known cause. Usually, it occurs when you are trying to perform an action. You are more likely   to develop this condition if you have a family member with essential tremor. The main sign of a tremor is a rhythmic shaking of certain parts of your body that is uncontrolled and unintentional. Treatment for essential  tremor depends on the severity of the condition. This information is not intended to replace advice given to you by your health care provider. Make sure you discuss any questions you have with your health care provider. Document Revised: 04/24/2021 Document Reviewed: 04/24/2021 Elsevier Patient Education  2024 [employer]  

## 2023-05-28 ENCOUNTER — Encounter: Payer: Self-pay | Admitting: Family Medicine

## 2023-07-11 ENCOUNTER — Other Ambulatory Visit: Payer: Self-pay | Admitting: Family Medicine

## 2023-07-11 DIAGNOSIS — G250 Essential tremor: Secondary | ICD-10-CM

## 2023-08-24 ENCOUNTER — Ambulatory Visit
Admission: EM | Admit: 2023-08-24 | Discharge: 2023-08-24 | Disposition: A | Discharge status: Home / Self Care | Payer: BLUE CROSS/BLUE SHIELD | Attending: Physician Assistant | Admitting: Physician Assistant

## 2023-08-24 DIAGNOSIS — J029 Acute pharyngitis, unspecified: Secondary | ICD-10-CM | POA: Diagnosis present

## 2023-08-24 DIAGNOSIS — R051 Acute cough: Secondary | ICD-10-CM | POA: Diagnosis not present

## 2023-08-24 DIAGNOSIS — J069 Acute upper respiratory infection, unspecified: Secondary | ICD-10-CM | POA: Insufficient documentation

## 2023-08-24 DIAGNOSIS — B9789 Other viral agents as the cause of diseases classified elsewhere: Secondary | ICD-10-CM | POA: Insufficient documentation

## 2023-08-24 LAB — GROUP A STREP BY PCR: Group A Strep by PCR: NOT DETECTED

## 2023-08-24 LAB — RESP PANEL BY RT-PCR (FLU A&B, COVID) ARPGX2
Influenza A by PCR: NEGATIVE
Influenza B by PCR: NEGATIVE
SARS Coronavirus 2 by RT PCR: NEGATIVE

## 2023-08-24 MED ORDER — PSEUDOEPH-BROMPHEN-DM 30-2-10 MG/5ML PO SYRP: 10.0000 mL | ORAL_SOLUTION | Freq: Four times a day (QID) | ORAL | 0 refills | Status: AC | PRN

## 2023-08-24 NOTE — Discharge Instructions (Signed)

## 2023-08-24 NOTE — ED Triage Notes (Signed)
 Pt c/o loss of taste,bodyaches,cough & congestion x3 days.

## 2023-08-24 NOTE — ED Provider Notes (Signed)
 [name]    CSN: [id number] Arrival date & time: 08/24/23  1137      History   Chief Complaint Chief Complaint  Patient presents with   Cough   Generalized Body Aches   Nasal Congestion    HPI Tracey Hood is a 33 y.o. female presenting for 3-day history of fatigue, cough, mild sore throat, congestion and loss of smell and taste x 3.  Denies fever, breathing difficulty, abdominal pain, vomiting or diarrhea.  Reports that her daughter is not sick with similar symptoms.  She has taken OTC meds.  HPI  History reviewed. No pertinent past medical history.  Patient Active Problem List   Diagnosis Date Noted   Indication for care in labor and delivery, antepartum 12/13/2014   Back pain affecting pregnancy 12/11/2014   Abdominal pain 12/10/2014   Pregnancy 11/27/2014   Encounter for suspected PROM, with rupture of membranes not found 11/27/2014   Indication for care or intervention related to labor and delivery 11/18/2014    History reviewed. No pertinent surgical history.  OB History     Gravida  2   Para  2   Term  2   Preterm  0   AB  0   Living  1      SAB  0   IAB  0   Ectopic  0   Multiple  0   Live Births  1            Home Medications    Prior to Admission medications   Medication Sig Start Date End Date Taking? Authorizing Provider  brompheniramine-pseudoephedrine-DM 30-2-10 MG/5ML syrup Take 10 mLs by mouth 4 (four) times daily as needed for up to 7 days. 08/24/23 08/31/23 Yes Arvis Huxley B, PA-C  meloxicam  (MOBIC ) 7.5 MG tablet Take 1 tablet (7.5 mg total) by mouth daily. 05/27/23  Yes Jones, Deanna C, MD  levonorgestrel (MIRENA) 20 MCG/DAY IUD by Intrauterine route.    [provider]  norethindrone-ethinyl estradiol (LOESTRIN) 1-20 MG-MCG tablet Take 1 tablet by mouth once daily Take daily for 21 days, no drug for 7 days, then start new pack. 07/05/23 07/04/24  [provider]  propranolol  (INDERAL )  10 MG tablet TAKE 1 TABLET BY MOUTH THREE TIMES A DAY 07/12/23   Joshua Cathryne BROCKS, MD    Family History Family History  Problem Relation Age of Onset   Hypertension Father    Alcohol abuse Neg Hx    Arthritis Neg Hx    Birth defects Neg Hx    Cancer Neg Hx    Drug abuse Neg Hx    Asthma Neg Hx    Diabetes Neg Hx    COPD Neg Hx    Depression Neg Hx    Early death Neg Hx    Hearing loss Neg Hx    Heart disease Neg Hx    Hyperlipidemia Neg Hx    Kidney disease Neg Hx    Learning disabilities Neg Hx    Mental illness Neg Hx    Mental retardation Neg Hx    Miscarriages / Stillbirths Neg Hx    Stroke Neg Hx    Vision loss Neg Hx    Varicose Veins Neg Hx     Social History Social History   Tobacco Use   Smoking status: Never   Smokeless tobacco: Never  Vaping Use   Vaping status: Never Used  Substance Use Topics   Alcohol use: No   Drug  use: No     Allergies   Patient has no known allergies.   Review of Systems Review of Systems  Constitutional:  Positive for fatigue. Negative for chills, diaphoresis and fever.  HENT:  Positive for congestion, rhinorrhea and sore throat. Negative for ear pain, sinus pressure and sinus pain.   Respiratory:  Positive for cough. Negative for shortness of breath.   Cardiovascular:  Negative for chest pain.  Gastrointestinal:  Negative for abdominal pain, nausea and vomiting.  Musculoskeletal:  Positive for myalgias.  Skin:  Negative for rash.  Neurological:  Negative for weakness and headaches.  Hematological:  Negative for adenopathy.     Physical Exam Triage Vital Signs ED Triage Vitals  Encounter Vitals Group     BP 08/24/23 1211 108/71     Systolic BP Percentile --      Diastolic BP Percentile --      Pulse Rate 08/24/23 1211 100     Resp 08/24/23 1211 16     Temp 08/24/23 1211 98.8 F (37.1 C)     Temp Source 08/24/23 1211 Oral     SpO2 08/24/23 1211 100 %     Weight 08/24/23 1211 160 lb (72.6 kg)     Height  08/24/23 1211 5' 4 (1.626 m)     Head Circumference --      Peak Flow --      Pain Score 08/24/23 1214 6     Pain Loc --      Pain Education --      Exclude from Growth Chart --    No data found.  Updated Vital Signs BP 108/71 (BP Location: Left Arm)   Pulse 100   Temp 98.8 F (37.1 C) (Oral)   Resp 16   Ht 5' 4 (1.626 m)   Wt 160 lb (72.6 kg)   LMP 08/15/2023 (Exact Date)   SpO2 100%   BMI 27.46 kg/m     Physical Exam Vitals and nursing note reviewed.  Constitutional:      General: She is not in acute distress.    Appearance: Normal appearance. She is not ill-appearing or toxic-appearing.  HENT:     Head: Normocephalic and atraumatic.     Nose: Congestion present.     Mouth/Throat:     Mouth: Mucous membranes are moist.     Pharynx: Oropharynx is clear. Posterior oropharyngeal erythema present.  Eyes:     General: No scleral icterus.       Right eye: No discharge.        Left eye: No discharge.     Conjunctiva/sclera: Conjunctivae normal.  Cardiovascular:     Rate and Rhythm: Normal rate and regular rhythm.     Heart sounds: Normal heart sounds.  Pulmonary:     Effort: Pulmonary effort is normal. No respiratory distress.     Breath sounds: Normal breath sounds.  Musculoskeletal:     Cervical back: Neck supple.  Skin:    General: Skin is dry.  Neurological:     General: No focal deficit present.     Mental Status: She is alert. Mental status is at baseline.     Motor: No weakness.     Gait: Gait normal.  Psychiatric:        Mood and Affect: Mood normal.        Behavior: Behavior normal.      UC Treatments / Results  Labs (all labs ordered are listed, but only abnormal results are displayed) Labs  Reviewed  RESP PANEL BY RT-PCR (FLU A&B, COVID) [id number]  GROUP A STREP BY PCR    EKG   Radiology No results found.  Procedures Procedures (including critical care time)  Medications Ordered in UC Medications - No data to display  Initial  Impression / Assessment and Plan / UC Course  I have reviewed the triage vital signs and the nursing notes.  Pertinent labs & imaging results that were available during my care of the patient were reviewed by me and considered in my medical decision making (see chart for details).   33 year old female presents with 3-day history of fatigue, cough, congestion, sore throat and loss of smell and taste.  Daughter sick as well.  Respiratory panel and strep testing obtained.  All negative.  Viral URI.  Supportive care encouraged with increasing rest and fluids.  Sent Bromfed-DM to pharmacy.  Reviewed typical course of most viral illnesses.  Reviewed return precautions.  Work note given.  \ Final Clinical Impressions(s) / UC Diagnoses   Final diagnoses:  Viral upper respiratory tract infection  Acute cough  Sore throat     Discharge Instructions      URI/COLD SYMPTOMS: Your exam today is consistent with a viral illness. Antibiotics are not indicated at this time. Use medications as directed, including cough syrup, nasal saline, and decongestants. Your symptoms should improve over the next few days and resolve within 7-10 days. Increase rest and fluids. F/u if symptoms worsen or predominate such as sore throat, ear pain, productive cough, shortness of breath, or if you develop high fevers or worsening fatigue over the next several days.       ED Prescriptions     Medication Sig Dispense Auth. Provider   brompheniramine-pseudoephedrine-DM 30-2-10 MG/5ML syrup Take 10 mLs by mouth 4 (four) times daily as needed for up to 7 days. 150 mL Arvis Jolan NOVAK, PA-C      PDMP not reviewed this encounter.   Arvis Jolan NOVAK, PA-C 08/24/23 1332

## 2023-08-25 ENCOUNTER — Ambulatory Visit: Payer: BLUE CROSS/BLUE SHIELD

## 2024-01-26 ENCOUNTER — Ambulatory Visit
Admission: RE | Admit: 2024-01-26 | Discharge: 2024-01-26 | Disposition: A | Discharge status: Home / Self Care | Source: Ambulatory Visit | Attending: Emergency Medicine | Admitting: Emergency Medicine

## 2024-01-26 VITALS — BP 113/74 | HR 77 | Temp 99.0°F | Ht 63.0 in | Wt 151.0 lb

## 2024-01-26 DIAGNOSIS — Z3201 Encounter for pregnancy test, result positive: Secondary | ICD-10-CM | POA: Diagnosis not present

## 2024-01-26 LAB — PREGNANCY, URINE: Preg Test, Ur: POSITIVE — AB

## 2024-01-26 NOTE — ED Provider Notes (Signed)
 [name]    CSN: [id number] Arrival date & time: 01/26/24  1431      History   Chief Complaint Chief Complaint  Patient presents with   Possible Pregnancy    HPI Tracey Hood is a 33 y.o. female.   33 year old female, Tracey Hood, presents to urgent care for pregnancy test confirmation.  Patient states that her last period was 12/29/2023 and is 6 days late, had a positive pregnancy test at home, is here for confirmation test.  Patient denies any dysuria, fever, vaginal discharge,abdominal pain or back pain  The history is provided by the patient. No language interpreter was used.    History reviewed. No pertinent past medical history.  Patient Active Problem List   Diagnosis Date Noted   Positive urine pregnancy test 01/26/2024   Indication for care in labor and delivery, antepartum 12/13/2014   Back pain affecting pregnancy 12/11/2014   Abdominal pain 12/10/2014   Pregnancy 11/27/2014   Encounter for suspected PROM, with rupture of membranes not found 11/27/2014   Indication for care or intervention related to labor and delivery 11/18/2014    History reviewed. No pertinent surgical history.  OB History     Gravida  2   Para  2   Term  2   Preterm  0   AB  0   Living  1      SAB  0   IAB  0   Ectopic  0   Multiple  0   Live Births  1            Home Medications    Prior to Admission medications   Medication Sig Start Date End Date Taking? Authorizing Provider  levonorgestrel (MIRENA) 20 MCG/DAY IUD by Intrauterine route.    [provider]  meloxicam  (MOBIC ) 7.5 MG tablet Take 1 tablet (7.5 mg total) by mouth daily. 05/27/23   Jones, Deanna C, MD  norethindrone-ethinyl estradiol (LOESTRIN) 1-20 MG-MCG tablet Take 1 tablet by mouth once daily Take daily for 21 days, no drug for 7 days, then start new pack. 07/05/23 07/04/24  [provider]  propranolol  (INDERAL ) 10 MG tablet TAKE 1 TABLET BY MOUTH  THREE TIMES A DAY 07/12/23   Joshua Cathryne BROCKS, MD    Family History Family History  Problem Relation Age of Onset   Hypertension Father    Alcohol abuse Neg Hx    Arthritis Neg Hx    Birth defects Neg Hx    Cancer Neg Hx    Drug abuse Neg Hx    Asthma Neg Hx    Diabetes Neg Hx    COPD Neg Hx    Depression Neg Hx    Early death Neg Hx    Hearing loss Neg Hx    Heart disease Neg Hx    Hyperlipidemia Neg Hx    Kidney disease Neg Hx    Learning disabilities Neg Hx    Mental illness Neg Hx    Mental retardation Neg Hx    Miscarriages / Stillbirths Neg Hx    Stroke Neg Hx    Vision loss Neg Hx    Varicose Veins Neg Hx     Social History Social History   Tobacco Use   Smoking status: Never   Smokeless tobacco: Never  Vaping Use   Vaping status: Never Used  Substance Use Topics   Alcohol use: No   Drug use: No     Allergies  Patient has no known allergies.   Review of Systems Review of Systems  Constitutional:  Negative for fever.  Gastrointestinal:  Negative for abdominal pain.  Genitourinary:  Negative for dysuria.  Musculoskeletal:  Negative for back pain.  All other systems reviewed and are negative.    Physical Exam Triage Vital Signs ED Triage Vitals  Encounter Vitals Group     BP 01/26/24 1459 113/74     Girls Systolic BP Percentile --      Girls Diastolic BP Percentile --      Boys Systolic BP Percentile --      Boys Diastolic BP Percentile --      Pulse Rate 01/26/24 1459 77     Resp --      Temp 01/26/24 1459 99 F (37.2 C)     Temp Source 01/26/24 1459 Oral     SpO2 01/26/24 1459 100 %     Weight 01/26/24 1457 151 lb (68.5 kg)     Height 01/26/24 1457 5' 3 (1.6 m)     Head Circumference --      Peak Flow --      Pain Score 01/26/24 1457 4     Pain Loc --      Pain Education --      Exclude from Growth Chart --    No data found.  Updated Vital Signs BP 113/74 (BP Location: Left Arm)   Pulse 77   Temp 99 F (37.2 C) (Oral)    Ht 5' 3 (1.6 m)   Wt 151 lb (68.5 kg)   LMP 12/29/2023   SpO2 100%   BMI 26.75 kg/m   Visual Acuity Right Eye Distance:   Left Eye Distance:   Bilateral Distance:    Right Eye Near:   Left Eye Near:    Bilateral Near:     Physical Exam Vitals and nursing note reviewed.  Cardiovascular:     Rate and Rhythm: Normal rate.  Pulmonary:     Effort: Pulmonary effort is normal.  Neurological:     General: No focal deficit present.     Mental Status: She is alert and oriented to person, place, and time.     GCS: GCS eye subscore is 4. GCS verbal subscore is 5. GCS motor subscore is 6.  Psychiatric:        Attention and Perception: Attention normal.        Mood and Affect: Mood normal.        Speech: Speech normal.        Behavior: Behavior normal.      UC Treatments / Results  Labs (all labs ordered are listed, but only abnormal results are displayed) Labs Reviewed  PREGNANCY, URINE - Abnormal; Notable for the following components:      Result Value   Preg Test, Ur POSITIVE (*)    All other components within normal limits    EKG   Radiology No results found.  Procedures Procedures (including critical care time)  Medications Ordered in UC Medications - No data to display  Initial Impression / Assessment and Plan / UC Course  I have reviewed the triage vital signs and the nursing notes.  Pertinent labs & imaging results that were available during my care of the patient were reviewed by me and considered in my medical decision making (see chart for details).    Discussed exam findings and plan of care with patient, patient is taking daily prenatal vitamin, has OB appointment scheduled  for August already, strict go to ER precautions given, patient verbalized understanding to this provider.  Ddx: Positive urine pregnancy test, missed period Final Clinical Impressions(s) / UC Diagnoses   Final diagnoses:  Positive urine pregnancy test     Discharge  Instructions      Your pregnancy test was positive at today's visit Take over-the-counter prenatal vitamin daily Follow-up with OB/GYN-call for appointment     ED Prescriptions   None    PDMP not reviewed this encounter.   Aminta Loose, NP 01/26/24 2018

## 2024-01-26 NOTE — ED Triage Notes (Signed)
 Pt c/o possible pregnancy  Pt denies any urinary or vaginal issues

## 2024-01-26 NOTE — Discharge Instructions (Signed)
 Your pregnancy test was positive at today's visit Take over-the-counter prenatal vitamin daily Follow-up with OB/GYN-call for appointment

## 2024-03-21 ENCOUNTER — Telehealth: Payer: Self-pay

## 2024-03-21 ENCOUNTER — Telehealth: Payer: Self-pay | Admitting: Family Medicine

## 2024-03-21 NOTE — Telephone Encounter (Signed)
 Patient called she is interested in establishing care and getting an appointment. Has some questions regarding insurance. Could someone call patient back and help her.

## 2024-03-22 ENCOUNTER — Emergency Department

## 2024-03-22 ENCOUNTER — Emergency Department
Admission: EM | Admit: 2024-03-22 | Discharge: 2024-03-22 | Disposition: A | Discharge status: Home / Self Care | Attending: Emergency Medicine | Admitting: Emergency Medicine

## 2024-03-22 ENCOUNTER — Encounter: Payer: Self-pay | Admitting: Emergency Medicine

## 2024-03-22 ENCOUNTER — Other Ambulatory Visit: Payer: Self-pay

## 2024-03-22 DIAGNOSIS — O209 Hemorrhage in early pregnancy, unspecified: Secondary | ICD-10-CM | POA: Diagnosis present

## 2024-03-22 DIAGNOSIS — R8271 Bacteriuria: Secondary | ICD-10-CM | POA: Diagnosis not present

## 2024-03-22 DIAGNOSIS — O99891 Other specified diseases and conditions complicating pregnancy: Secondary | ICD-10-CM

## 2024-03-22 DIAGNOSIS — Z3A12 12 weeks gestation of pregnancy: Secondary | ICD-10-CM | POA: Diagnosis not present

## 2024-03-22 DIAGNOSIS — O4690 Antepartum hemorrhage, unspecified, unspecified trimester: Secondary | ICD-10-CM

## 2024-03-22 LAB — CHLAMYDIA/NGC RT PCR (ARMC ONLY)
Chlamydia Tr: NOT DETECTED
N gonorrhoeae: NOT DETECTED

## 2024-03-22 LAB — CBC WITH DIFFERENTIAL/PLATELET
Abs Immature Granulocytes: 0.07 K/uL (ref 0.00–0.07)
Basophils Absolute: 0 K/uL (ref 0.0–0.1)
Basophils Relative: 0 %
Eosinophils Absolute: 0.1 K/uL (ref 0.0–0.5)
Eosinophils Relative: 1 %
HCT: 36.5 % (ref 36.0–46.0)
Hemoglobin: 12.1 g/dL (ref 12.0–15.0)
Immature Granulocytes: 1 %
Lymphocytes Relative: 16 %
Lymphs Abs: 1.8 K/uL (ref 0.7–4.0)
MCH: 31 pg (ref 26.0–34.0)
MCHC: 33.2 g/dL (ref 30.0–36.0)
MCV: 93.6 fL (ref 80.0–100.0)
Monocytes Absolute: 0.7 K/uL (ref 0.1–1.0)
Monocytes Relative: 6 %
Neutro Abs: 8.4 K/uL — ABNORMAL HIGH (ref 1.7–7.7)
Neutrophils Relative %: 76 %
Platelets: 204 K/uL (ref 150–400)
RBC: 3.9 MIL/uL (ref 3.87–5.11)
RDW: 13.4 % (ref 11.5–15.5)
WBC: 11 K/uL — ABNORMAL HIGH (ref 4.0–10.5)
nRBC: 0 % (ref 0.0–0.2)

## 2024-03-22 LAB — URINALYSIS, ROUTINE W REFLEX MICROSCOPIC
Bilirubin Urine: NEGATIVE
Glucose, UA: NEGATIVE mg/dL
Ketones, ur: NEGATIVE mg/dL
Nitrite: NEGATIVE
Protein, ur: NEGATIVE mg/dL
Specific Gravity, Urine: 1.016 (ref 1.005–1.030)
pH: 7 (ref 5.0–8.0)

## 2024-03-22 LAB — WET PREP, GENITAL
Clue Cells Wet Prep HPF POC: NONE SEEN
Sperm: NONE SEEN
Trich, Wet Prep: NONE SEEN
WBC, Wet Prep HPF POC: >=10 — AB (ref <10)
Yeast Wet Prep HPF POC: NONE SEEN

## 2024-03-22 LAB — RAPID HIV SCREEN (HIV 1/2 AB+AG)
HIV 1/2 Antibodies: NONREACTIVE
HIV-1 P24 Antigen - HIV24: NONREACTIVE

## 2024-03-22 LAB — HCG, QUANTITATIVE, PREGNANCY: hCG, Beta Chain, Quant, S: 96835 m[IU]/mL — ABNORMAL HIGH (ref <5)

## 2024-03-22 LAB — RPR: RPR Ser Ql: NONREACTIVE

## 2024-03-22 MED ORDER — CEPHALEXIN 500 MG PO CAPS: 500.0000 mg | ORAL_CAPSULE | Freq: Two times a day (BID) | ORAL | 0 refills | Status: AC

## 2024-03-22 NOTE — Discharge Instructions (Signed)
 Your blood work was reassuring today.  Your urine showed a small amount of bacteria so we will treat this with an antibiotic.  Please take this as prescribed.  The wet prep was negative for bacterial vaginosis, yeast infection and trichomoniasis.  The HIV test was nonreactive.  The gonorrhea, chlamydia and syphilis test is still pending.  Please follow these results on MyChart.  Your ultrasound showed a single live intrauterine gestation estimated at 12 weeks and 7 days old, heart rate was 164 bpm.  This is normal.  Please follow-up with your OB/GYN.  Return to the emergency department with any worsening symptoms like increased bleeding or development of pain.

## 2024-03-22 NOTE — ED Provider Notes (Signed)
 [facility] Provider Note    Event Date/Time   First MD Initiated Contact with Patient 03/22/24 [phone]     (approximate)   History   Vaginal Bleeding   HPI  Tracey Hood is a 33 y.o. female G3P2002 proximately [redacted] weeks pregnant who presents for evaluation of vaginal bleeding.  Patient states that for the past week or so each morning she is had a small amount of light pink spotting, this morning the spotting was a little bit more and darker red.  She also reports having some light pink discharge.  Denies abdominal pain.      Physical Exam   Triage Vital Signs: ED Triage Vitals  Encounter Vitals Group     BP 03/22/24 0836 126/75     Girls Systolic BP Percentile --      Girls Diastolic BP Percentile --      Boys Systolic BP Percentile --      Boys Diastolic BP Percentile --      Pulse Rate 03/22/24 0836 77     Resp 03/22/24 0836 16     Temp 03/22/24 0836 98.4 F (36.9 C)     Temp Source 03/22/24 0836 Oral     SpO2 03/22/24 0836 100 %     Weight 03/22/24 0837 156 lb (70.8 kg)     Height 03/22/24 0837 5' 3 (1.6 m)     Head Circumference --      Peak Flow --      Pain Score 03/22/24 0837 0     Pain Loc --      Pain Education --      Exclude from Growth Chart --     Most recent vital signs: Vitals:   03/22/24 0836  BP: 126/75  Pulse: 77  Resp: 16  Temp: 98.4 F (36.9 C)  SpO2: 100%   General: Awake, no distress.  CV:  Good peripheral perfusion. RRR. Resp:  Normal effort. CTAB. Abd:  No distention.  Other:     ED Results / Procedures / Treatments   Labs (all labs ordered are listed, but only abnormal results are displayed) Labs Reviewed  WET PREP, GENITAL - Abnormal; Notable for the following components:      Result Value   WBC, Wet Prep HPF POC >=10 (*)    All other components within normal limits  CBC WITH DIFFERENTIAL/PLATELET - Abnormal; Notable for the following components:   WBC 11.0 (*)    Neutro Abs 8.4 (*)     All other components within normal limits  URINALYSIS, ROUTINE W REFLEX MICROSCOPIC - Abnormal; Notable for the following components:   Color, Urine YELLOW (*)    APPearance HAZY (*)    Hgb urine dipstick LARGE (*)    Leukocytes,Ua SMALL (*)    Bacteria, UA RARE (*)    All other components within normal limits  HCG, QUANTITATIVE, PREGNANCY - Abnormal; Notable for the following components:   hCG, Beta Chain, Quant, S [id number] (*)    All other components within normal limits  CHLAMYDIA/NGC RT PCR ([facility] ONLY)            RAPID HIV SCREEN (HIV 1/2 AB+AG)  RPR    RADIOLOGY  OB ultrasound obtained, interpreted the images as well as reviewed the radiologist report.  Images show a single live intrauterine gestation estimated at 12 weeks and 82 days old.  No complicating features like a subchorionic hemorrhage seen.  Heart rate around 164 bpm  PROCEDURES:  Critical Care performed: No  Procedures   MEDICATIONS ORDERED IN ED: Medications - No data to display   IMPRESSION / MDM / ASSESSMENT AND PLAN / ED COURSE  I reviewed the triage vital signs and the nursing notes.                             33 year old female presents for evaluation of vaginal spotting and [redacted] weeks pregnant.  Vital signs are stable patient NAD on exam.  Differential diagnosis includes, but is not limited to, UTI, BV, yeast infection, STD, subchorionic hemorrhage, miscarriage, normal pregnancy.   Patient's presentation is most consistent with acute complicated illness / injury requiring diagnostic workup.  Reviewed patient's chart for type and screen, patient is B+ blood type, no indication for Rhogam if having a miscarriage.  Will obtain labs and get ultrasound to confirm viability of pregnancy.  Given patient's report of small amounts of bleeding consistently over the past week greater suspicion for subchorionic hemorrhage versus cervicitis from BV or yeast infection.  Patient has a low suspicion for STDs but asked  to be tested anyway.  She has had an ultrasound by her OB that confirmed IUP so do not suspect ectopic pregnancy.   Clinical Course as of 03/22/24 1124  Thu Mar 22, 2024  0948 Wet prep, genital(!) Negative for BV, yeast and trichomoniasis.  [LD]  9051 CBC with Differential/Platelet(!) Unremarkable, no anemia [LD]  1119 Rapid HIV screen (HIV 1/2 Ab+Ag) Nonreactive. [LD]  1119 US  OB Comp Less 14 Wks Intrauterine pregnancy measuring around 12 weeks 6 days with heart rate 164 bpm [LD]  1120 Chlamydia/NGC rt PCR ([facility] only) In process, patient informed to follow-up with her results on MyChart [LD]  1120 RPR In process, patient informed to follow-up with her results on MyChart. [LD]  1120 Urinalysis, Routine w reflex microscopic -Urine, Clean Catch(!) Hemoglobin, small leukocytes with rare bacteria.  Will cover patient with short course of oral antibiotics for asymptomatic bacteriuria. [LD]  1121 hCG, quantitative, pregnancy(!) Appropriate level given gestational age [LD]  1121 Work up is reassuring and patient was given reassurance about her baby.  Did review return precautions like increased bleeding or development of abdominal pain.  Advised her to follow-up with her OB/GYN, patient reports she has an appointment scheduled for next week.  Patient voiced understanding, all questions were answered and she was stable at discharge. [LD]    Clinical Course User Index [LD] Cleaster Tinnie LABOR, PA-C     FINAL CLINICAL IMPRESSION(S) / ED DIAGNOSES   Final diagnoses:  Vaginal bleeding in pregnancy  Asymptomatic bacteriuria during pregnancy     Rx / DC Orders   ED Discharge Orders          Ordered    cephALEXin  (KEFLEX ) 500 MG capsule  2 times daily        03/22/24 1123             Note:  This document was prepared using Dragon voice recognition software and may include unintentional dictation errors.   Cleaster Tinnie LABOR, PA-C 03/22/24 1125    Viviann Pastor,  MD 03/22/24 [phone]

## 2024-03-22 NOTE — ED Notes (Signed)
 See triage note  Presents with some vaginal spotting over the past couple of days  States it started off pink  then became red  Denies any passing of clots

## 2024-03-22 NOTE — ED Triage Notes (Signed)
 Pt reports she is [redacted] weeks pregnant and has noticed some vaginal spotting. Pt also reports abdominal discomfort and back pain. Pt reports this is 3rd pregnancy.

## 2024-03-23 NOTE — Telephone Encounter (Signed)
 Returned patient phone call. States she went to ER for vaginal bleeding concerns and has decided to utilize other OB Provider and cancel 04/02/2024 visit at [facility]. Patient has initial OB visit with [facility] on 04/04/2024. Consuelo Kingsley RN

## 2024-03-26 ENCOUNTER — Telehealth (INDEPENDENT_AMBULATORY_CARE_PROVIDER_SITE_OTHER)

## 2024-03-26 ENCOUNTER — Telehealth: Payer: Self-pay

## 2024-03-26 DIAGNOSIS — Z3480 Encounter for supervision of other normal pregnancy, unspecified trimester: Secondary | ICD-10-CM

## 2024-03-26 NOTE — Telephone Encounter (Signed)
 During new ob intake pt has questions on price of visits sending to front desk for an estimate.

## 2024-03-26 NOTE — Progress Notes (Signed)
 New OB Intake  I connected with  Tracey Hood on 03/26/24 at  8:15 AM EDT by MyChart Video Visit and verified that I am speaking with the correct person using two identifiers. Nurse is located at [facility] and pt is located at home.  I discussed the limitations, risks, security and privacy concerns of performing an evaluation and management service by telephone and the availability of in person appointments. I also discussed with the patient that there may be a patient responsible charge related to this service. The patient expressed understanding and agreed to proceed.  I explained I am completing New OB Intake today. We discussed her EDD of 09/29/23 that is based on LMP of 12/29/2023. Pt is G3/P2. I reviewed her allergies, medications, Medical/Surgical/OB history, and appropriate screenings. There are cats in the home: no. . Based on history, this is a/an pregnancy complicated by none . Her obstetrical history is significant for none.  Patient Active Problem List   Diagnosis Date Noted   Supervision of other normal pregnancy, antepartum 03/26/2024   Positive urine pregnancy test 01/26/2024   Indication for care in labor and delivery, antepartum 12/13/2014   Back pain affecting pregnancy 12/11/2014   Abdominal pain 12/10/2014   Pregnancy 11/27/2014   Encounter for suspected PROM, with rupture of membranes not found 11/27/2014   Indication for care or intervention related to labor and delivery 11/18/2014    Concerns addressed today:Pt had an episode of bleeding went to ED was given antibiotic as provider was unsure why she was bleeding, has US  already done at that time as well.    Delivery Plans:  Plans to deliver at [facility].  Anatomy US   Anatomy US  will be scheduled around [redacted] weeks gestational age.  Labs Discussed genetic screening with patient. Patient Declined genetic testing to be drawn at new OB visit. Discussed possible labs to be drawn at new OB  appointment.  COVID Vaccine Patient has had COVID vaccine.   Social Determinants of Health Food Insecurity: denies food insecurity Transportation: Patient denies transportation needs. Childcare: Discussed no children allowed at ultrasound appointments.   First visit review I reviewed new OB appt with pt. I explained she will have blood work and pap smear/pelvic exam if indicated. Explained pt will be seen by Jinnie Cookey CNM at first visit; encounter routed to appropriate provider.   Annalee VEAR Sanders, CMA 03/26/2024  9:01 AM

## 2024-04-03 NOTE — Progress Notes (Unsigned)
 New Obstetric Patient H&P    Chief Complaint: Desires prenatal care   History of Present Illness: Patient is a 33 y.o. H6E7997 Not Hispanic or Latino female, presents with amenorrhea and positive home pregnancy test. Patient's last menstrual period was 12/29/2023. and based on her  LMP, her EDD is Estimated Date of Delivery: 09/28/24 and her EGA is [redacted]w[redacted]d. Cycles are {0-35:19561} {days/wks/mos/yrs:310907}, {Desc; regular/irreg:14544}, and occur approximately every : {numbers 22-35:14824} days. Her last pap smear was {numbers (fuzzy):14653} years ago and was {Findings; lab pap smear results:16707::no abnormalities}.    She had a urine pregnancy test which was positive {numbers (fuzzy):14653} {time frame:9076}  ago. Her last menstrual period was normal and lasted for  {numbers (fuzzy):14653} {time frame:9076}. Since her LMP she claims she has experienced ***. She denies vaginal bleeding. Her past medical history is {Noncontribuatory/Contributory:21644}. Her prior pregnancies are notable for {pregnancy complications:12320}  Since her LMP, she admits to the use of tobacco products  {yes/no:63} She claims she has gained   {inf wt change:14817} pounds since the start of her pregnancy.  There are cats in the home in the home  {yes/no:63} If yes {Desc; indoor/outdoor:13239} She admits close contact with children on a regular basis  {yes/no:63}  She has had chicken pox in the past {yes/no/unknown:74} She has had Tuberculosis exposures, symptoms, or previously tested positive for TB   {yes/no:63} Current or past history of domestic violence. {yes/no:63}  Genetic Screening/Teratology Counseling: (Includes patient, baby's father, or anyone in either family with:)   1. Patient's age >/= 20 at [facility]  {yes/no:63} 2. Thalassemia ([place], [place], Mediterranean, or Asian background): MCV<80  {yes/no:63} 3. Neural tube defect (meningomyelocele, spina bifida, anencephaly)  {yes/no:63} 4. Congenital heart  defect  {yes/no:63}  5. Down syndrome  {yes/no:63} 6. Tay-Sachs (Jewish, [place]  {yes/no:63} 7. Canavan's Disease  {yes/no:63} 8. Sickle cell disease or trait (African)  {yes/no:63}  9. Hemophilia or other blood disorders  {yes/no:63}  10. Muscular dystrophy  {yes/no:63}  11. Cystic fibrosis  {yes/no:63}  12. Huntington's Chorea  {yes/no:63}  13. Mental retardation/autism  {yes/no:63} 14. Other inherited genetic or chromosomal disorder  {yes/no:63} 15. Maternal metabolic disorder (DM, PKU, etc)  {yes/no:63} 16. Patient or FOB with a child with a birth defect not listed above no  16a. Patient or FOB with a birth defect themselves {yes/no:63} 17. Recurrent pregnancy loss, or stillbirth  {yes/no:63}  18. Any medications since LMP other than prenatal vitamins (include vitamins, supplements, OTC meds, drugs, alcohol)  {yes/no:63} 19. Any other genetic/environmental exposure to discuss  {yes/no:63}  Infection History:   1. Lives with someone with TB or TB exposed  {yes/no:63}  2. Patient or partner has history of genital herpes  {yes/no:63} 3. Rash or viral illness since LMP  {yes/no:63} 4. History of STI (GC, CT, HPV, syphilis, HIV)  {yes/no:63} 5. History of recent travel :  {yes/no:63}  Other pertinent information:  {yes/no:63}     Review of Systems:10 point review of systems negative unless otherwise noted in HPI  Past Medical History:  Patient Active Problem List   Diagnosis Date Noted   Supervision of other normal pregnancy, antepartum 03/26/2024     Clinical Staff Provider  Office Location  [facility] Dating  Not found.  Language  English Anatomy US     Flu Vaccine  OFFER Genetic Screen  NIPS:   TDaP vaccine  OFFER Hgb A1C or  GTT Early : Third trimester :   Covid Has had   LAB RESULTS  Rhogam    Blood Type     RSV OFFER Antibody    Feeding Plan BREAST Rubella    Contraception IUD RPR NON REACTIVE (09/04 0844)   Circumcision YES IF BOY HBsAg      Pediatrician  [facility] HIV NON REACTIVE (09/04 0844)  Support Person Mother Varicella    Prenatal Classes YES GBS  (For PCN allergy, check sensitivities)     Hep C     BTL Consent  Pap No results found for: DIAGPAP  VBAC Consent  Hgb Electro      CF      SMA             Positive urine pregnancy test 01/26/2024   Indication for care in labor and delivery, antepartum 12/13/2014   Back pain affecting pregnancy 12/11/2014   Abdominal pain 12/10/2014   Pregnancy 11/27/2014   Encounter for suspected PROM, with rupture of membranes not found 11/27/2014   Indication for care or intervention related to labor and delivery 11/18/2014    Past Surgical History:  No past surgical history on file.  Gynecologic History: Patient's last menstrual period was 12/29/2023.  Obstetric History: [id number]  Family History:  Family History  Problem Relation Age of Onset   Hypertension Father    Alcohol abuse Neg Hx    Arthritis Neg Hx    Birth defects Neg Hx    Cancer Neg Hx    Drug abuse Neg Hx    Asthma Neg Hx    Diabetes Neg Hx    COPD Neg Hx    Depression Neg Hx    Early death Neg Hx    Hearing loss Neg Hx    Heart disease Neg Hx    Hyperlipidemia Neg Hx    Kidney disease Neg Hx    Learning disabilities Neg Hx    Mental illness Neg Hx    Mental retardation Neg Hx    Miscarriages / Stillbirths Neg Hx    Stroke Neg Hx    Vision loss Neg Hx    Varicose Veins Neg Hx     Social History:  Social History   Socioeconomic History   Marital status: Single    Spouse name: Not on file   Number of children: 2   Years of education: Not on file   Highest education level: Associate degree: academic program  Occupational History   Not on file  Tobacco Use   Smoking status: Never   Smokeless tobacco: Never  Vaping Use   Vaping status: Never Used  Substance and Sexual Activity   Alcohol use: No   Drug use: No   Sexual activity: Yes  Other Topics Concern   Not on file  Social  History Narrative   Not on file   Social Drivers of Health   Financial Resource Strain: Low Risk  (03/26/2024)   Overall Financial Resource Strain (CARDIA)    Difficulty of Paying Living Expenses: Not hard at all  Recent Concern: Financial Resource Strain - Medium Risk (01/19/2024)   Received from [facility]   Overall Financial Resource Strain (CARDIA)    Difficulty of Paying Living Expenses: Somewhat hard  Food Insecurity: Food Insecurity Present (03/23/2024)   Hunger Vital Sign    Worried About Running Out of Food in the Last Year: Sometimes true    Ran Out of Food in the Last Year: Never true  Transportation Needs: No Transportation Needs (03/23/2024)   PRAPARE - Transportation  Lack of Transportation (Medical): No    Lack of Transportation (Non-Medical): No  Physical Activity: Insufficiently Active (01/19/2024)   Received from [facility]   Exercise Vital Sign    On average, how many days per week do you engage in moderate to strenuous exercise (like a brisk walk)?: 2 days    On average, how many minutes do you engage in exercise at this level?: 30 min  Stress: No Stress Concern Present (01/19/2024)   Received from [facility] - Occupational Stress Questionnaire    Feeling of Stress : Only a little  Social Connections: Moderately Isolated (01/19/2024)   Received from [facility]   Social Connection and Isolation Panel    In a typical week, how many times do you talk on the phone with family, friends, or neighbors?: More than three times a week    How often do you get together with friends or relatives?: Never    How often do you attend church or religious services?: 1 to 4 times per year    Do you belong to any clubs or organizations such as church groups, unions, fraternal or athletic groups, or school groups?: No    How often do you attend meetings of the clubs or  organizations you belong to?: Never    Are you married, widowed, divorced, separated, never married, or living with a partner?: Divorced  Intimate Partner Violence: Not At Risk (03/26/2024)   Humiliation, Afraid, Rape, and Kick questionnaire    Fear of Current or Ex-Partner: No    Emotionally Abused: No    Physically Abused: No    Sexually Abused: No    Allergies:  No Known Allergies  Medications: Prior to Admission medications   Medication Sig Start Date End Date Taking? Authorizing Provider  levonorgestrel (MIRENA) 20 MCG/DAY IUD by Intrauterine route.    [provider]  norethindrone-ethinyl estradiol (LOESTRIN) 1-20 MG-MCG tablet Take 1 tablet by mouth once daily Take daily for 21 days, no drug for 7 days, then start new pack. 07/05/23 07/04/24  [provider]  Prenatal Vit-Fe Fumarate-FA (PRENATAL MULTIVITAMIN) TABS tablet Take 1 tablet by mouth daily at 12 noon.    [provider]  propranolol  (INDERAL ) 10 MG tablet TAKE 1 TABLET BY MOUTH THREE TIMES A DAY Patient not taking: Reported on 03/26/2024 07/12/23   Joshua Cathryne BROCKS, MD    Physical Exam Vitals: Last menstrual period 12/29/2023.  General: NAD HEENT: normocephalic, anicteric Thyroid : no enlargement, no palpable nodules Pulmonary: No increased work of breathing, CTAB Cardiovascular: RRR, distal pulses 2+ Abdomen: NABS, soft, non-tender, non-distended.  Umbilicus without lesions.  No hepatomegaly, splenomegaly or masses palpable. No evidence of hernia  Genitourinary:  External: Normal external female genitalia.  Normal urethral meatus, normal  Bartholin's and Skene's glands.    Vagina: Normal vaginal mucosa, no evidence of prolapse.    Cervix: Grossly normal in appearance, no bleeding  Uterus: *** Non-enlarged, mobile, normal contour.  No CMT  Adnexa: ovaries non-enlarged, no adnexal masses  Rectal: deferred Extremities: no edema, erythema, or tenderness Neurologic: Grossly  intact Psychiatric: mood appropriate, affect full   Assessment: 33 y.o. G3P2002 at [redacted]w[redacted]d presenting to initiate prenatal care  Plan: 1) Avoid alcoholic beverages. 2) Patient encouraged not to smoke.  3) Discontinue the use of all non-medicinal drugs and chemicals.  4) Take prenatal vitamins daily.  5) Nutrition, food safety (fish, cheese advisories, and high nitrite foods)  and exercise discussed. 6) Hospital and practice style discussed with cross coverage system.  7) Genetic Screening, such as with 1st Trimester Screening, cell free fetal DNA, AFP testing, and Ultrasound, as well as with amniocentesis and CVS as appropriate, is discussed with patient. At the conclusion of today's visit patient {Desc; requested/declined/undecided:14580} genetic testing 8) Patient is asked about travel to areas at risk for the Zika virus, and counseled to avoid travel and exposure to mosquitoes or sexual partners who may have themselves been exposed to the virus. Testing is discussed, and will be ordered as appropriate.   Burnard LITTIE Ro, CMA  09/16/253:44 PM

## 2024-04-04 ENCOUNTER — Ambulatory Visit (INDEPENDENT_AMBULATORY_CARE_PROVIDER_SITE_OTHER): Admitting: Licensed Practical Nurse

## 2024-04-04 VITALS — BP 122/69 | HR 98 | Wt 161.0 lb

## 2024-04-04 DIAGNOSIS — Z3A14 14 weeks gestation of pregnancy: Secondary | ICD-10-CM

## 2024-04-04 DIAGNOSIS — Z369 Encounter for antenatal screening, unspecified: Secondary | ICD-10-CM

## 2024-04-04 DIAGNOSIS — Z3402 Encounter for supervision of normal first pregnancy, second trimester: Secondary | ICD-10-CM

## 2024-04-04 DIAGNOSIS — Z1379 Encounter for other screening for genetic and chromosomal anomalies: Secondary | ICD-10-CM

## 2024-04-04 MED ORDER — ASPIRIN 81 MG PO CHEW: 81.0000 mg | CHEWABLE_TABLET | Freq: Every day | ORAL | Status: DC

## 2024-04-04 NOTE — Addendum Note (Signed)
 Addended by: KIZZIE CAMELIA CROME on: 04/04/2024 02:04 PM   Modules accepted: Orders

## 2024-04-06 ENCOUNTER — Other Ambulatory Visit

## 2024-04-06 LAB — MONITOR DRUG PROFILE 14(MW)
Amphetamine Scrn, Ur: NEGATIVE ng/mL
BARBITURATE SCREEN URINE: NEGATIVE ng/mL
BENZODIAZEPINE SCREEN, URINE: NEGATIVE ng/mL
Buprenorphine, Urine: NEGATIVE ng/mL
CANNABINOIDS UR QL SCN: NEGATIVE ng/mL
Cocaine (Metab) Scrn, Ur: NEGATIVE ng/mL
Creatinine(Crt), U: 133.5 mg/dL (ref 20.0–300.0)
Fentanyl, Urine: NEGATIVE pg/mL
Meperidine Screen, Urine: NEGATIVE ng/mL
Methadone Screen, Urine: NEGATIVE ng/mL
OXYCODONE+OXYMORPHONE UR QL SCN: NEGATIVE ng/mL
Opiate Scrn, Ur: NEGATIVE ng/mL
Ph of Urine: 6.5 (ref 4.5–8.9)
Phencyclidine Qn, Ur: NEGATIVE ng/mL
Propoxyphene Scrn, Ur: NEGATIVE ng/mL
SPECIFIC GRAVITY: 1.016
Tramadol Screen, Urine: NEGATIVE ng/mL

## 2024-04-06 LAB — CULTURE, OB URINE

## 2024-04-06 LAB — NICOTINE SCREEN, URINE: Cotinine Ql Scrn, Ur: NEGATIVE ng/mL

## 2024-04-06 LAB — URINE CULTURE, OB REFLEX

## 2024-04-06 NOTE — Addendum Note (Signed)
 Addended by: DELANA SAUER on: 04/06/2024 09:53 AM   Modules accepted: Orders

## 2024-04-08 LAB — CBC/D/PLT+RPR+RH+ABO+RUBIGG...
Antibody Screen: NEGATIVE
Basophils Absolute: 0 x10E3/uL (ref 0.0–0.2)
Basos: 0 %
EOS (ABSOLUTE): 0.1 x10E3/uL (ref 0.0–0.4)
Eos: 1 %
HCV Ab: NONREACTIVE
HIV Screen 4th Generation wRfx: NONREACTIVE
Hematocrit: 33.8 % — ABNORMAL LOW (ref 34.0–46.6)
Hemoglobin: 11.2 g/dL (ref 11.1–15.9)
Hepatitis B Surface Ag: NEGATIVE
Immature Grans (Abs): 0.1 x10E3/uL (ref 0.0–0.1)
Immature Granulocytes: 1 %
Lymphocytes Absolute: 1.8 x10E3/uL (ref 0.7–3.1)
Lymphs: 17 %
MCH: 31.8 pg (ref 26.6–33.0)
MCHC: 33.1 g/dL (ref 31.5–35.7)
MCV: 96 fL (ref 79–97)
Monocytes Absolute: 0.7 x10E3/uL (ref 0.1–0.9)
Monocytes: 7 %
Neutrophils Absolute: 8.1 x10E3/uL — ABNORMAL HIGH (ref 1.4–7.0)
Neutrophils: 74 %
Platelets: 219 x10E3/uL (ref 150–450)
RBC: 3.52 x10E6/uL — ABNORMAL LOW (ref 3.77–5.28)
RDW: 12.8 % (ref 11.7–15.4)
RPR Ser Ql: NONREACTIVE
Rh Factor: POSITIVE
Rubella Antibodies, IGG: 3.95 {index} (ref >0.99)
Varicella zoster IgG: REACTIVE
WBC: 10.8 x10E3/uL (ref 3.4–10.8)

## 2024-04-08 LAB — HGB FRACTIONATION CASCADE
Hgb A2: 2.6 % (ref 1.8–3.2)
Hgb A: 97.4 % (ref 96.4–98.8)
Hgb F: 0 % (ref 0.0–2.0)
Hgb S: 0 %

## 2024-04-08 LAB — HCV INTERPRETATION

## 2024-05-02 ENCOUNTER — Ambulatory Visit

## 2024-05-02 ENCOUNTER — Ambulatory Visit (INDEPENDENT_AMBULATORY_CARE_PROVIDER_SITE_OTHER): Admitting: Licensed Practical Nurse

## 2024-05-02 ENCOUNTER — Encounter: Payer: Self-pay | Admitting: Licensed Practical Nurse

## 2024-05-02 VITALS — BP 115/62 | HR 73 | Wt 165.9 lb

## 2024-05-02 DIAGNOSIS — Z3402 Encounter for supervision of normal first pregnancy, second trimester: Secondary | ICD-10-CM

## 2024-05-02 DIAGNOSIS — Z3480 Encounter for supervision of other normal pregnancy, unspecified trimester: Secondary | ICD-10-CM

## 2024-05-02 DIAGNOSIS — Z369 Encounter for antenatal screening, unspecified: Secondary | ICD-10-CM | POA: Diagnosis not present

## 2024-05-02 DIAGNOSIS — Z3A18 18 weeks gestation of pregnancy: Secondary | ICD-10-CM

## 2024-05-02 DIAGNOSIS — Z3A14 14 weeks gestation of pregnancy: Secondary | ICD-10-CM

## 2024-05-02 NOTE — Progress Notes (Unsigned)
    Return Prenatal Note   Subjective   33 y.o. H6E7997 at [redacted]w[redacted]d presents for this follow-up prenatal visit.  Patient  Patient reports:Doing well, no concerns. Is not planning to travel  Movement: Present Contractions: Not present  Objective   Flow sheet Vitals: Pulse Rate: 73 BP: 115/62 Fetal Heart Rate (bpm): 150 Total weight gain: 9 lb 14.4 oz (4.491 kg)  General Appearance  No acute distress, well appearing, and well nourished Pulmonary   Normal work of breathing Neurologic   Alert and oriented to person, place, and time Psychiatric   Mood and affect within normal limits   Assessment/Plan   Plan  33 y.o. H6E7997 at [redacted]w[redacted]d presents for follow-up OB visit. Reviewed prenatal record including previous visit note.  Supervision of other normal pregnancy, antepartum -Anatomy US  today, normal  -Declined AFP  -Is taking ASA      No orders of the defined types were placed in this encounter.  Return in about 4 weeks (around 05/30/2024) for ROB.   Future Appointments  Date Time Provider Department Center  06/01/2024  8:15 AM Justino Eleanor HERO, CNM [facility] None    For next visit:  continue with routine prenatal care     Rufus Cypert [facility], CNM  10/16/254:35 PM

## 2024-05-02 NOTE — Patient Instructions (Signed)
 Second Trimester of Pregnancy  The second trimester of pregnancy is from week 14 through week 27. This is months 4 through 6 of pregnancy. During the second trimester: Morning sickness is less or has stopped. You may have more energy. You may feel hungry more often. At this time, your unborn baby is growing very fast. At the end of the sixth month, the unborn baby may be up to 12 inches long and weigh about 1 pounds. You will likely start to feel the baby move between 16 and 20 weeks of pregnancy. Body changes during your second trimester Your body continues to change during this time. The changes usually go away after your baby is born. Physical changes You will gain more weight. Your belly will get bigger. You may begin to get stretch marks on your hips, belly, and breasts. Your breasts will keep growing and may hurt. You may get dark spots or blotches on your face. A dark line from your belly button to the pubic area may appear. This line is called linea nigra. Your hair may grow faster and get thicker. Health changes You may have headaches. You may have heartburn. You may pee more often. You may have swollen, bulging veins (varicose veins). You may have trouble pooping (constipation), or swollen veins in the butt that can itch or get painful (hemorrhoids). You may have back pain. This is caused by: Weight gain. Pregnancy hormones that are relaxing the joints in your pelvis. Follow these instructions at home: Medicines Talk to your health care provider if you're taking medicines. Ask if the medicines are safe to take during pregnancy. Your provider may change the medicines that you take. Do not take any medicines unless told to by your provider. Take a prenatal vitamin that has at least 600 micrograms (mcg) of folic acid. Do not use herbal medicines, illegal drugs, or medicines that are not approved by your provider. Eating and drinking While you're pregnant your body needs  extra food for your growing baby. Talk with your provider about what to eat while pregnant. Activity Most women are able to exercise during pregnancy. Exercises may need to change as your pregnancy goes on. Talk to your provider about your activities and exercise routines. Relieving pain and discomfort Wear a good, supportive bra if your breasts hurt. Rest with your legs raised if you have leg cramps or low back pain. Take warm sitz baths to soothe pain from hemorrhoids. Use hemorrhoid cream if your provider says it's okay. Do not douche. Do not use tampons or scented pads. Do not use hot tubs, steam rooms, or saunas. Safety Wear your seatbelt at all times when you're in a car. Talk to your provider if someone hits you, hurts you, or yells at you. Talk with your provider if you're feeling sad or have thoughts of hurting yourself. Lifestyle Certain things can be harmful while you're pregnant. It's best to avoid the following: Do not drink alcohol,smoke, vape, or use products with nicotine or tobacco in them. If you need help quitting, talk with your provider. Avoid cat litter boxes and soil used by cats. These things carry germs that can cause harm to your pregnancy and your baby. General instructions Keep all follow-up visits. It helps you and your unborn baby stay as healthy as possible. Write down your questions. Take them to your prenatal visits. Your provider will: Talk with you about your overall health. Give you advice or refer you to specialists who can help with different needs,  including: Prenatal education classes. Mental health and counseling. Foods and healthy eating. Ask for help if you need help with food. Where to find more information American Pregnancy Association: americanpregnancy.org [employer]ians [employer]necologists: acog.org Office on [employer] Health: [employer]Lesson.ca Contact a health care provider if: You have a headache that does not go away  when you take medicine. You have any of these problems: You can't eat or drink. You throw up or feel like you may throw up. You have watery poop (diarrhea) for 2 days or more. You have pain when you pee or your pee smells bad. You have been sick for 2 days or more and are not getting better. Contact your provider right away if: You have any of these coming from your vagina: Abnormal discharge. Bad-smelling fluid. Bleeding. Your baby is moving less than usual. You have contractions, belly cramping, or have pain in your pelvis or lower back. You have symptoms of high blood pressure or preeclampsia. These include: A severe, throbbing headache that does not go away. Sudden or extreme swelling of your face, hands, legs, or feet. Vision problems: You see spots. You have blurry vision. Your eyes are sensitive to light. If you can't reach the provider, go to an urgent care or emergency room. Get help right away if: You faint, become confused, or can't think clearly. You have chest pain or trouble breathing. You have any kind of injury, such as from a fall or a car crash. These symptoms may be an emergency. Call 911 right away. Do not wait to see if the symptoms will go away. Do not drive yourself to the hospital. This information is not intended to replace advice given to you by your health care provider. Make sure you discuss any questions you have with your health care provider. Document Revised: 04/07/2023 Document Reviewed: 11/05/2022 Elsevier Patient Education  2024 [employer].

## 2024-05-03 NOTE — Assessment & Plan Note (Addendum)
-  Anatomy US  today, normal  -Declined AFP  -Is taking ASA

## 2024-05-28 NOTE — Progress Notes (Unsigned)
    Return Prenatal Note   Assessment/Plan   Plan  33 y.o. G3P2002 at [redacted]w[redacted]d presents for follow-up OB visit. Reviewed prenatal record including previous visit note.  Supervision of other normal pregnancy, antepartum -Discussed 28-week labs at next visit -Discussed common discomforts of late 2nd trimester -Reviewed kick counts and preterm labor warning signs. Instructed to call office or come to hospital with persistent headache, vision changes, regular contractions, leaking of fluid, decreased fetal movement or vaginal bleeding.      Orders Placed This Encounter  Procedures   28 Week RH+Panel    Standing Status:   Future    Expiration Date:   06/01/2025   Return in about 4 weeks (around 06/29/2024).   Future Appointments  Date Time Provider Department Center  06/29/2024  8:15 AM Dominic, Jinnie Jansky, CNM [facility] None  06/29/2024  8:40 AM [facility] None    For next visit:  ROB with 28-week labs and TDaP    Subjective   Tracey Hood is feeling great. 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 She and her children are looking forward to meeting their baby.  Movement: Present Contractions: Not present  Objective   Flow sheet Vitals: Pulse Rate: 79 BP: 116/70 Fundal Height: 24 cm Fetal Heart Rate (bpm): 152 Total weight gain: 14 lb (6.35 kg)  General Appearance  No acute distress, well appearing, and well nourished Pulmonary   Normal work of breathing Neurologic   Alert and oriented to person, place, and time Psychiatric   Mood and affect within normal limits  Eleanor Canny, CNM 06/01/24 8:46 AM

## 2024-06-01 ENCOUNTER — Encounter: Payer: Self-pay | Admitting: Obstetrics

## 2024-06-01 ENCOUNTER — Ambulatory Visit (INDEPENDENT_AMBULATORY_CARE_PROVIDER_SITE_OTHER): Admitting: Obstetrics

## 2024-06-01 VITALS — BP 116/70 | HR 79 | Wt 170.0 lb

## 2024-06-01 DIAGNOSIS — Z113 Encounter for screening for infections with a predominantly sexual mode of transmission: Secondary | ICD-10-CM

## 2024-06-01 DIAGNOSIS — Z3482 Encounter for supervision of other normal pregnancy, second trimester: Secondary | ICD-10-CM

## 2024-06-01 DIAGNOSIS — Z3480 Encounter for supervision of other normal pregnancy, unspecified trimester: Secondary | ICD-10-CM

## 2024-06-01 DIAGNOSIS — D649 Anemia, unspecified: Secondary | ICD-10-CM

## 2024-06-01 DIAGNOSIS — Z3A23 23 weeks gestation of pregnancy: Secondary | ICD-10-CM | POA: Diagnosis not present

## 2024-06-01 DIAGNOSIS — Z131 Encounter for screening for diabetes mellitus: Secondary | ICD-10-CM

## 2024-06-01 NOTE — Assessment & Plan Note (Signed)
-  Discussed 28-week labs at next visit -Discussed common discomforts of late 2nd trimester -Reviewed kick counts and preterm labor warning signs. Instructed to call office or come to hospital with persistent headache, vision changes, regular contractions, leaking of fluid, decreased fetal movement or vaginal bleeding.

## 2024-06-29 ENCOUNTER — Encounter: Admitting: Licensed Practical Nurse

## 2024-06-29 ENCOUNTER — Other Ambulatory Visit

## 2024-07-04 NOTE — Patient Instructions (Signed)
 Third Trimester of Pregnancy  The third trimester of pregnancy is from week 28 through week 40. This is months 7 through 9. The third trimester is a time when your baby is growing fast. Body changes during your third trimester Your body continues to change during this time. The changes usually go away after your baby is born. Physical changes You will continue to gain weight. You may get stretch marks on your hips, belly, and breasts. Your breasts will keep growing and may hurt. A yellow fluid (colostrum) may leak from your breasts. This is the first milk you're making for your baby. Your hair may grow faster and get thicker. In some cases, you may get hair loss. Your belly button may stick out. You may have more swelling in your hands, face, or ankles. Health changes You may have heartburn. You may feel short of breath. This is caused by the uterus that is now bigger. You may have more aches in the pelvis, back, or thighs. You may have more tingling or numbness in your hands, arms, and legs. You may pee more often. You may have trouble pooping (constipation) or swollen veins in the butt that can itch or get painful (hemorrhoids). Other changes You may have more problems sleeping. You may notice the baby moving lower in your belly (dropping). You may have more fluid coming from your vagina. Your joints may feel loose, and you may have pain around your pelvic bone. Follow these instructions at home: Medicines Take medicines only as told by your health care provider. Some medicines are not safe during pregnancy. Your provider may change the medicines that you take. Do not take any medicines unless told to by your provider. Take a prenatal vitamin that has at least 600 micrograms (mcg) of folic acid. Do not use herbal medicines, illegal drugs, or medicines that are not approved by your provider. Eating and drinking While you're pregnant your body needs additional nutrition to help  support your growing baby. Talk with your provider about your nutritional needs. Activity Most women are able to exercise regularly during pregnancy. Exercise routines may need to change at the end of your pregnancy. Talk to your provider about your activities and exercise routine. Relieving pain and discomfort Rest often with your legs raised if you have leg cramps or low back pain. Take warm sitz baths to soothe pain from hemorrhoids. Use hemorrhoid cream if your provider says it's okay. Wear a good, supportive bra if your breasts hurt. Do not use hot tubs, steam rooms, or saunas. Do not douche. Do not use tampons or scented pads. Safety Talk to your provider before traveling far distances. Wear your seatbelt at all times when you're in a car. Talk to your provider if someone hits you, hurts you, or yells at you. Preparing for birth To prepare for your baby: Take childbirth and breastfeeding classes. Visit the hospital and tour the maternity area. Buy a rear-facing car seat. Learn how to install it in your car. General instructions Avoid cat litter boxes and soil used by cats. These things carry germs that can cause harm to your pregnancy and your baby. Do not drink alcohol, smoke, vape, or use products with nicotine or tobacco in them. If you need help quitting, talk with your provider. Keep all follow-up visits for your third trimester. Your provider will do more exams and tests during this trimester. Write down your questions. Take them to your prenatal visits. Your provider also will: Talk with you about  your overall health. Give you advice or refer you to specialists who can help with different needs, including: Mental health and counseling. Foods and healthy eating. Ask for help if you need help with food. Where to find more information American Pregnancy Association: americanpregnancy.org [employer]necologists: acog.org Office on [employer] Health:  TravelLesson.ca Contact a health care provider if: You have a headache that does not go away when you take medicine. You have any of these problems: You can't eat or drink. You have nausea and vomiting. You have watery poop (diarrhea) for 2 days or more. You have pain when you pee, or your pee smells bad. You have been sick for 2 days or more and aren't getting better. Contact your provider right away if: You have any of these coming from your vagina: Abnormal discharge. Bad-smelling fluid. Bleeding. Your baby is moving less than usual. You have signs of labor: You have any contractions, belly cramping, or have pain in your pelvis or lower back before 37 weeks of pregnancy (preterm labor). You have regular contractions that are less than 5 minutes apart. Your water breaks. You have symptoms of high blood pressure or preeclampsia. These include: A severe, throbbing headache that does not go away. Sudden or extreme swelling of your face, hands, legs, or feet. Vision problems: You see spots. You have blurry vision. Your eyes are sensitive to light. If you can't reach your provider, go to an urgent care or emergency room. Get help right away if: You faint, become confused, or can't think clearly. You have chest pain or trouble breathing. You have any kind of injury, such as from a fall or a car crash. These symptoms may be an emergency. Call 911 right away. Do not wait to see if the symptoms will go away. Do not drive yourself to the hospital. This information is not intended to replace advice given to you by your health care provider. Make sure you discuss any questions you have with your health care provider. Document Revised: 04/07/2023 Document Reviewed: 11/05/2022 Elsevier Patient Education  2024 [employer].  Tdap (Tetanus, Diphtheria, Pertussis) Vaccine: What You Need to Know Many vaccine information statements are available in Spanish and other languages. See  PromoAge.com.br. 1. Why get vaccinated? Tdap vaccine can prevent tetanus, diphtheria, and pertussis. Diphtheria and pertussis spread from person to person. Tetanus enters the body through cuts or wounds. TETANUS (T) causes painful stiffening of the muscles. Tetanus can lead to serious health problems, including being unable to open the mouth, having trouble swallowing and breathing, or death. DIPHTHERIA (D) can lead to difficulty breathing, heart failure, paralysis, or death. PERTUSSIS (aP), also known as "whooping cough," can cause uncontrollable, violent coughing that makes it hard to breathe, eat, or drink. Pertussis can be extremely serious especially in babies and young children, causing pneumonia, convulsions, brain damage, or death. In teens and adults, it can cause weight loss, loss of bladder control, passing out, and rib fractures from severe coughing. 2. Tdap vaccine Tdap is only for children 7 years and older, adolescents, and adults.  Adolescents should receive a single dose of Tdap, preferably at age 23 or 12 years. Pregnant people should get a dose of Tdap during every pregnancy, preferably during the early part of the third trimester, to help protect the newborn from pertussis. Infants are most at risk for severe, life-threatening complications from pertussis. Adults who have never received Tdap should get a dose of Tdap. Also, adults should receive a booster  dose of either Tdap or Td (a different vaccine that protects against tetanus and diphtheria but not pertussis) every 10 years, or after 5 years in the case of a severe or dirty wound or burn. Tdap may be given at the same time as other vaccines. 3. Talk with your health care provider Tell your vaccine provider if the person getting the vaccine: Has had an allergic reaction after a previous dose of any vaccine that protects against tetanus, diphtheria, or pertussis, or has any severe, life-threatening allergies Has had a  coma, decreased level of consciousness, or prolonged seizures within 7 days after a previous dose of any pertussis vaccine (DTP, DTaP, or Tdap) Has seizures or another nervous system problem Has ever had Guillain-Barr Syndrome (also called "GBS") Has had severe pain or swelling after a previous dose of any vaccine that protects against tetanus or diphtheria In some cases, your health care provider may decide to postpone Tdap vaccination until a future visit. People with minor illnesses, such as a cold, may be vaccinated. People who are moderately or severely ill should usually wait until they recover before getting Tdap vaccine.  Your health care provider can give you more information. 4. Risks of a vaccine reaction Pain, redness, or swelling where the shot was given, mild fever, headache, feeling tired, and nausea, vomiting, diarrhea, or stomachache sometimes happen after Tdap vaccination. People sometimes faint after medical procedures, including vaccination. Tell your provider if you feel dizzy or have vision changes or ringing in the ears.  As with any medicine, there is a very remote chance of a vaccine causing a severe allergic reaction, other serious injury, or death. 5. What if there is a serious problem? An allergic reaction could occur after the vaccinated person leaves the clinic. If you see signs of a severe allergic reaction (hives, swelling of the face and throat, difficulty breathing, a fast heartbeat, dizziness, or weakness), call 9-1-1 and get the person to the nearest hospital. For other signs that concern you, call your health care provider.  Adverse reactions should be reported to the Vaccine Adverse Event Reporting System (VAERS). Your health care provider will usually file this report, or you can do it yourself. Visit the VAERS website at www.vaers.LAgents.no or call 937-175-7282. VAERS is only for reporting reactions, and VAERS staff members do not give medical advice. 6. The  National Vaccine Injury Compensation Program The [employer] Vaccine Injury Compensation Program (VICP) is a federal program that was created to compensate people who may have been injured by certain vaccines. Claims regarding alleged injury or death due to vaccination have a time limit for filing, which may be as short as two years. Visit the VICP website at SpiritualWord.at or call (925)586-3174 to learn about the program and about filing a claim. 7. How can I learn more? Ask your health care provider. Call your local or state health department. Visit the website of the Food and Drug Administration (FDA) for vaccine package inserts and additional information at FinderList.no. Contact the Centers for Disease Control and Prevention (CDC): Call 9790839027 (1-800-CDC-INFO) or Visit CDC's website at PicCapture.uy. Source: CDC Vaccine Information Statement Tdap (Tetanus, Diphtheria, Pertussis) Vaccine (02/22/2020) This same material is available at FootballExhibition.com.br for no charge. This information is not intended to replace advice given to you by your health care provider. Make sure you discuss any questions you have with your health care provider. Document Revised: 10/20/2022 Document Reviewed: 08/20/2022 Elsevier Patient Education  2024 [employer].

## 2024-07-05 ENCOUNTER — Encounter: Admitting: Registered Nurse

## 2024-07-05 ENCOUNTER — Other Ambulatory Visit

## 2024-07-05 VITALS — BP 115/74 | HR 99 | Wt 178.5 lb

## 2024-07-05 DIAGNOSIS — Z3480 Encounter for supervision of other normal pregnancy, unspecified trimester: Secondary | ICD-10-CM

## 2024-07-05 DIAGNOSIS — Z3A23 23 weeks gestation of pregnancy: Secondary | ICD-10-CM

## 2024-07-05 DIAGNOSIS — Z23 Encounter for immunization: Secondary | ICD-10-CM

## 2024-07-05 DIAGNOSIS — Z3A27 27 weeks gestation of pregnancy: Secondary | ICD-10-CM | POA: Insufficient documentation

## 2024-07-05 DIAGNOSIS — Z113 Encounter for screening for infections with a predominantly sexual mode of transmission: Secondary | ICD-10-CM

## 2024-07-05 DIAGNOSIS — D649 Anemia, unspecified: Secondary | ICD-10-CM

## 2024-07-05 DIAGNOSIS — Z131 Encounter for screening for diabetes mellitus: Secondary | ICD-10-CM

## 2024-07-05 DIAGNOSIS — R309 Painful micturition, unspecified: Secondary | ICD-10-CM | POA: Insufficient documentation

## 2024-07-05 LAB — POCT URINALYSIS DIPSTICK
Appearance: NORMAL
Bilirubin, UA: NEGATIVE
Glucose, UA: NEGATIVE
Ketones, UA: NEGATIVE
Leukocytes, UA: NEGATIVE
Nitrite, UA: NEGATIVE
Odor: NORMAL
Protein, UA: NEGATIVE
Spec Grav, UA: 1.02 (ref 1.010–1.025)
Urobilinogen, UA: 0.2 U/dL
pH, UA: 5 (ref 5.0–8.0)

## 2024-07-05 NOTE — Progress Notes (Unsigned)
° ° °  Return Prenatal Note   Subjective   33 y.o. H6E7997 at [redacted]w[redacted]d presents for this follow-up prenatal visit.  Patient reports dysuria. Not taking ASA b/c she doesn't understand why she needs to. Feels sick with URI. Tdap deferred. Patient reports: Movement: Present Contractions: Irritability  Objective   Flow sheet Vitals: Pulse Rate: 99 BP: 115/74 Total weight gain: 22 lb 8 oz (10.2 kg)  General Appearance  No acute distress, well appearing, and well nourished Pulmonary   Normal work of breathing Neurologic   Alert and oriented to person, place, and time Psychiatric   Mood and affect within normal limits   Assessment/Plan   Plan  33 y.o. [id number] at [redacted]w[redacted]d presents for follow-up OB visit. Reviewed prenatal record including previous visit note.  No problem-specific Assessment & Plan notes found for this encounter.      Orders Placed This Encounter  Procedures   POCT Urinalysis Dipstick   Return in 2 weeks (on 07/19/2024) for 2 week ROB.   No future appointments.  For next visit:  continue with routine prenatal care     [place], [facility] ([place]  07/05/2024 10:44 AM

## 2024-07-05 NOTE — Assessment & Plan Note (Signed)
 Reviewed ASA 81 mg for prex prevention. Pt has no hx of prex in prior pregnancies.  Tdap at next visit. Urine culture today for dysuria. Urine dip with some blood. No leuks, nitrites or protein. Will defer tx for now. Encourage aggressive po hydration.

## 2024-07-06 ENCOUNTER — Telehealth: Payer: Self-pay

## 2024-07-06 LAB — 28 WEEK RH+PANEL
Basophils Absolute: 0 x10E3/uL (ref 0.0–0.2)
Basos: 0 %
EOS (ABSOLUTE): 0.1 x10E3/uL (ref 0.0–0.4)
Eos: 1 %
Gestational Diabetes Screen: 178 mg/dL — ABNORMAL HIGH (ref 70–139)
HIV Screen 4th Generation wRfx: NONREACTIVE
Hematocrit: 33.8 % — ABNORMAL LOW (ref 34.0–46.6)
Hemoglobin: 11.4 g/dL (ref 11.1–15.9)
Immature Grans (Abs): 0 x10E3/uL (ref 0.0–0.1)
Immature Granulocytes: 0 %
Lymphocytes Absolute: 1.3 x10E3/uL (ref 0.7–3.1)
Lymphs: 13 %
MCH: 32 pg (ref 26.6–33.0)
MCHC: 33.7 g/dL (ref 31.5–35.7)
MCV: 95 fL (ref 79–97)
Monocytes Absolute: 0.9 x10E3/uL (ref 0.1–0.9)
Monocytes: 9 %
Neutrophils Absolute: 7.6 x10E3/uL — ABNORMAL HIGH (ref 1.4–7.0)
Neutrophils: 77 %
Platelets: 194 x10E3/uL (ref 150–450)
RBC: 3.56 x10E6/uL — ABNORMAL LOW (ref 3.77–5.28)
RDW: 13.2 % (ref 11.7–15.4)
RPR Ser Ql: NONREACTIVE
WBC: 9.9 x10E3/uL (ref 3.4–10.8)

## 2024-07-06 NOTE — Telephone Encounter (Signed)
 Per Missy:  Can you please let Jazira know about these results and help her get scheduled for a 3-hr?   Patient aware, transferred to front desk to schedule 3 hr GTT.

## 2024-07-06 NOTE — Telephone Encounter (Signed)
 Spoke with the patient offering multiple days to scheduled her 3 hour fasting lab appointment. The patient declined every appointment due to work and the upcoming holidays. The patient requested to call back to scheduled at an later time.

## 2024-07-07 LAB — URINE CULTURE

## 2024-07-08 ENCOUNTER — Ambulatory Visit: Payer: Self-pay | Admitting: Registered Nurse

## 2024-07-18 NOTE — Progress Notes (Unsigned)
" ° ° °  Return Prenatal Note   Assessment/Plan   Plan  33 y.o. G3P2002 at [redacted]w[redacted]d  presents for follow-up OB visit. Reviewed prenatal record including previous visit note.  No problem-specific Assessment & Plan notes found for this encounter.    No orders of the defined types were placed in this encounter.  No follow-ups on file.   Future Appointments  Date Time Provider Department Center  07/20/2024  8:15 AM Justino Eleanor HERO, CNM [facility] None  07/23/2024  8:00 AM [facility] None    For next visit:  {Return Prenatal Care:31737}    Subjective       Objective   Flow sheet Vitals:   Total weight gain: 22 lb 8 oz (10.2 kg)  General Appearance  No acute distress, well appearing, and well nourished Pulmonary   Normal work of breathing Neurologic   Alert and oriented to person, place, and time Psychiatric   Mood and affect within normal limits  Eleanor Justino, CNM 07/18/2024 9:51 AM  "

## 2024-07-20 ENCOUNTER — Ambulatory Visit: Admitting: Obstetrics

## 2024-07-20 VITALS — BP 100/67 | HR 96 | Wt 179.0 lb

## 2024-07-20 DIAGNOSIS — Z3480 Encounter for supervision of other normal pregnancy, unspecified trimester: Secondary | ICD-10-CM

## 2024-07-20 DIAGNOSIS — Z3A30 30 weeks gestation of pregnancy: Secondary | ICD-10-CM | POA: Diagnosis not present

## 2024-07-20 DIAGNOSIS — O99810 Abnormal glucose complicating pregnancy: Secondary | ICD-10-CM | POA: Diagnosis not present

## 2024-07-20 DIAGNOSIS — R7309 Other abnormal glucose: Secondary | ICD-10-CM | POA: Insufficient documentation

## 2024-07-20 NOTE — Assessment & Plan Note (Addendum)
-  Plans epidural -Desires TDaP - left before getting it today. Will give at next visit. -Reviewed kick counts and preterm labor warning signs. Instructed to call office or come to hospital with persistent headache, vision changes, regular contractions, leaking of fluid, decreased fetal movement or vaginal bleeding.

## 2024-07-20 NOTE — Assessment & Plan Note (Signed)
-  Returning Monday for 3-hour glucose -Discussed dietary changes and regular exercise

## 2024-07-23 ENCOUNTER — Other Ambulatory Visit

## 2024-07-24 LAB — GESTATIONAL GLUCOSE TOLERANCE
Glucose, Fasting: 98 mg/dL — ABNORMAL HIGH (ref 70–94)
Glucose, GTT - 1 Hour: 196 mg/dL — ABNORMAL HIGH (ref 70–179)
Glucose, GTT - 2 Hour: 171 mg/dL — ABNORMAL HIGH (ref 70–154)
Glucose, GTT - 3 Hour: 144 mg/dL — ABNORMAL HIGH (ref 70–139)

## 2024-07-26 ENCOUNTER — Other Ambulatory Visit: Payer: Self-pay | Admitting: Obstetrics

## 2024-07-26 ENCOUNTER — Encounter: Payer: Self-pay | Admitting: Obstetrics

## 2024-07-26 DIAGNOSIS — O24419 Gestational diabetes mellitus in pregnancy, unspecified control: Secondary | ICD-10-CM | POA: Insufficient documentation

## 2024-07-26 DIAGNOSIS — O24410 Gestational diabetes mellitus in pregnancy, diet controlled: Secondary | ICD-10-CM

## 2024-07-26 MED ORDER — BLOOD GLUCOSE MONITORING SUPPL DEVI: 1.0000 | Freq: Three times a day (TID) | 0 refills | Status: AC

## 2024-07-26 MED ORDER — BLOOD GLUCOSE TEST VI STRP: 1.0000 | ORAL_STRIP | Freq: Three times a day (TID) | 3 refills | Status: DC

## 2024-07-26 MED ORDER — LANCET DEVICE MISC: 1.0000 | Freq: Three times a day (TID) | 3 refills | Status: AC

## 2024-07-26 MED ORDER — LANCETS MISC: 1.0000 | 0 refills | Status: AC

## 2024-07-26 NOTE — Progress Notes (Signed)
 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  Supplies ordered, referral placed to nutrition, growth US  ordered.  Tracey Hood M Larisa Lanius, CNM

## 2024-07-28 ENCOUNTER — Observation Stay
Admission: EM | Admit: 2024-07-28 | Discharge: 2024-07-28 | Disposition: A | Discharge status: Home / Self Care | Attending: Licensed Practical Nurse | Admitting: Licensed Practical Nurse

## 2024-07-28 ENCOUNTER — Encounter: Payer: Self-pay | Admitting: Obstetrics and Gynecology

## 2024-07-28 DIAGNOSIS — Z7982 Long term (current) use of aspirin: Secondary | ICD-10-CM | POA: Diagnosis not present

## 2024-07-28 DIAGNOSIS — O24419 Gestational diabetes mellitus in pregnancy, unspecified control: Secondary | ICD-10-CM | POA: Diagnosis not present

## 2024-07-28 DIAGNOSIS — O368130 Decreased fetal movements, third trimester, not applicable or unspecified: Principal | ICD-10-CM

## 2024-07-28 DIAGNOSIS — Z3A31 31 weeks gestation of pregnancy: Secondary | ICD-10-CM | POA: Diagnosis not present

## 2024-07-28 DIAGNOSIS — O368190 Decreased fetal movements, unspecified trimester, not applicable or unspecified: Principal | ICD-10-CM | POA: Diagnosis present

## 2024-07-28 LAB — CHLAMYDIA/NGC RT PCR (ARMC ONLY)
Chlamydia Tr: NOT DETECTED
N gonorrhoeae: NOT DETECTED

## 2024-07-28 LAB — WET PREP, GENITAL
Clue Cells Wet Prep HPF POC: NONE SEEN
Sperm: NONE SEEN
Trich, Wet Prep: NONE SEEN
WBC, Wet Prep HPF POC: <10
Yeast Wet Prep HPF POC: NONE SEEN

## 2024-07-28 LAB — RUPTURE OF MEMBRANE (ROM)PLUS: Rom Plus: NEGATIVE

## 2024-07-28 NOTE — OB Triage Note (Signed)
 Patient here for decreased fetal movement and LOF. She staes that in the last week baby has not been moving as much and for the past two weeks she has been having LOF that is very watery and clear. She states she has been wearing a liner. Today she has just put on these clothes before coming and her underwear is already soaked. She reports no bledding or contractions. Fasting blood sugar today was 102. Monitors applied.

## 2024-07-28 NOTE — Discharge Summary (Signed)
 Physician Final Progress Note  Patient ID: Tracey Hood MRN: [id number] DOB/AGE: 1990/11/06 34 y.o.  Admit date: 07/28/2024 Admitting provider: Veva FORBES Larch, DO Discharge date: 07/28/2024   Admission Diagnoses:  1) intrauterine pregnancy at [redacted]w[redacted]d  2) decreased fetal movement   Discharge Diagnoses:  Principal Problem:   Decreased fetal movement   History of Present Illness: The patient is a 34 y.o. female G3P2002 at [redacted]w[redacted]d who presents for decrease fetal movement and leaking fluid x2weeks. Tracey Hood reported she has not felt normal movement over the last day or 2. Normally the fetus is very active first thing the morning, just after launch and at bedtime, with periodic movement between those times. This morning she did not fele that normal vigorous movement when she woke up, if she stands still she can feel some movement, but the movements are not very vigorous. She has not done anything out of the ordinary lately. She has felt leaking for the last 2 weeks, she has worn a panty liner that she needs to change throughout the day because it is soaked. She denies any odor or irritation. She has not had IC since the beginning of pregnancy.   Tracey Hood has recently been diagnosed with GDM, she has been checking her blood glucose for the last 2 days, her fasting today was 102.   Past Medical History:  Diagnosis Date   Gestational diabetes 07/26/2024   [ x ]supplies ordered [  x]nutrition referral   A1- good glycemic control [  ]growth q4-6w @28w  Antenatal testing: none [  ]delivery 39.0-39.6  A1- good glycemic control with LGA and/or polyhydramnios [  ]growth q4-6w @24w  [  ]weekly Antenatal testing: 32w [  ]delivery 38.0-39.0     No past surgical history on file.  Medications Ordered Prior to Encounter[1]  Allergies[2]  Social History   Socioeconomic History   Marital status: Single    Spouse name: Not on file   Number of children: 2   Years of education: Not on  file   Highest education level: Associate degree: academic program  Occupational History   Not on file  Tobacco Use   Smoking status: Never   Smokeless tobacco: Never  Vaping Use   Vaping status: Never Used  Substance and Sexual Activity   Alcohol use: No   Drug use: No   Sexual activity: Yes  Other Topics Concern   Not on file  Social History Narrative   Not on file   Social Drivers of Health   Tobacco Use: Low Risk (07/05/2024)   Patient History    Smoking Tobacco Use: Never    Smokeless Tobacco Use: Never    Passive Exposure: Not on file  Financial Resource Strain: Low Risk (03/26/2024)   Overall Financial Resource Strain (CARDIA)    Difficulty of Paying Living Expenses: Not hard at all  Recent Concern: Financial Resource Strain - Medium Risk (01/19/2024)   Received from [facility]   Overall Financial Resource Strain (CARDIA)    Difficulty of Paying Living Expenses: Somewhat hard  Food Insecurity: Food Insecurity Present (03/23/2024)   Epic    Worried About Programme Researcher, Broadcasting/film/video in the Last Year: Sometimes true    Ran Out of Food in the Last Year: Never true  Transportation Needs: No Transportation Needs (03/23/2024)   Epic    Lack of Transportation (Medical): No    Lack of Transportation (Non-Medical): No  Physical Activity: Insufficiently Active (01/19/2024)   Received from [facility]  Exercise Vital Sign    On average, how many days per week do you engage in moderate to strenuous exercise (like a brisk walk)?: 2 days    On average, how many minutes do you engage in exercise at this level?: 30 min  Stress: No Stress Concern Present (01/19/2024)   Received from [facility] - Occupational Stress Questionnaire    Feeling of Stress : Only a little  Social Connections: Moderately Isolated (01/19/2024)   Received from [facility]   Social Connection and Isolation  Panel    In a typical week, how many times do you talk on the phone with family, friends, or neighbors?: More than three times a week    How often do you get together with friends or relatives?: Never    How often do you attend church or religious services?: 1 to 4 times per year    Do you belong to any clubs or organizations such as church groups, unions, fraternal or athletic groups, or school groups?: No    How often do you attend meetings of the clubs or organizations you belong to?: Never    Are you married, widowed, divorced, separated, never married, or living with a partner?: Divorced  Intimate Partner Violence: Not At Risk (03/26/2024)   Epic    Fear of Current or Ex-Partner: No    Emotionally Abused: No    Physically Abused: No    Sexually Abused: No  Depression (PHQ2-9): Low Risk (03/26/2024)   Depression (PHQ2-9)    PHQ-2 Score: 0  Alcohol Screen: Low Risk (03/26/2024)   Alcohol Screen    Last Alcohol Screening Score (AUDIT): 0  Housing: High Risk (03/23/2024)   Epic    Unable to Pay for Housing in the Last Year: Yes    Number of Times Moved in the Last Year: 0    Homeless in the Last Year: No  Utilities: Not At Risk (03/23/2024)   Epic    Threatened with loss of utilities: No  Health Literacy: Adequate Health Literacy (01/19/2024)   Received from [facility]   [id number] Health Literacy    How often do you need to have someone help you when you read instructions, pamphlets, or other written material from your doctor or pharmacy?: Never    Family History  Problem Relation Age of Onset   Hypertension Father    Alcohol abuse Neg Hx    Arthritis Neg Hx    Birth defects Neg Hx    Cancer Neg Hx    Drug abuse Neg Hx    Asthma Neg Hx    Diabetes Neg Hx    COPD Neg Hx    Depression Neg Hx    Early death Neg Hx    Hearing loss Neg Hx    Heart disease Neg Hx    Hyperlipidemia Neg Hx    Kidney disease Neg Hx    Learning disabilities Neg Hx    Mental illness Neg Hx     Mental retardation Neg Hx    Miscarriages / Stillbirths Neg Hx    Stroke Neg Hx    Vision loss Neg Hx    Varicose Veins Neg Hx      ROS   Physical Exam: BP 126/74   Pulse 92   Temp 98.8 F (37.1 C)   LMP 12/29/2023   Physical Exam Constitutional:      Appearance: Normal appearance.  Genitourinary:  Vulva normal.     Genitourinary Comments: SSE: cervix visualized closed, homogenous white discharge present, no bleeding or odor  Negative pooling  Cardiovascular:     Rate and Rhythm: Normal rate.  Pulmonary:     Effort: Pulmonary effort is normal.  Abdominal:     Tenderness: There is no abdominal tenderness.     Comments: gravid  Musculoskeletal:     Cervical back: Normal range of motion.     Right lower leg: No edema.     Left lower leg: No edema.  Neurological:     General: No focal deficit present.     Mental Status: She is alert.  Skin:    General: Skin is warm.  Psychiatric:        Mood and Affect: Mood normal.        Thought Content: Thought content normal.    Tracey Hood 09-29-1990 [redacted]w[redacted]d  Fetus A Non-Stress Test Interpretation for 07/28/2024  Indication: decrease fetal movement   Fetal Heart Rate A Mode: External Baseline Rate (A): 135 bpm Variability: Moderate Accelerations: 10 x 10, 15 x 15 Decelerations: Variable, Late  Uterine Activity Mode: Toco Contraction Frequency (min): ui with occ ctx Contraction Duration (sec): 30-80 Contraction Quality: Mild      Consults: None  Significant Findings/ Diagnostic Studies: ROM plus, wet prep, gc/ct negative   Procedures: RNST, [facility] Course: The patient was admitted to Labor and Delivery Triage for observation. Her tracing was overall reassuring but one late deceleration and variable were noted, Dr Rayma -Eure was called, BSUS was done by Dr Davis, she was found to have an AFI of 23, fetal movement and breathing were noted during the US . Since being in triage, the pt  has reported feeling movement. Given the reassuring tracing and US  she was discharged home to follow op with her scheduled ROB.   Discharge Condition: good  Disposition: Discharge disposition: 01-Home or Self Care       Diet: diabetic diet   Discharge Activity: Activity as tolerated   Allergies as of 07/28/2024   No Known Allergies      Medication List     TAKE these medications    aspirin  81 MG chewable tablet Chew 1 tablet (81 mg total) by mouth daily.   Blood Glucose Monitoring Suppl Devi 1 each by Does not apply route in the morning, at noon, and at bedtime. May substitute to any manufacturer covered by patient's insurance.   BLOOD GLUCOSE TEST STRIPS Strp 1 each by In Vitro route in the morning, at noon, and at bedtime. May substitute to any manufacturer covered by patient's insurance.   Lancet Device Misc 1 each by Does not apply route in the morning, at noon, and at bedtime. May substitute to any manufacturer covered by patient's insurance.   Lancets Misc 1 each by Does not apply route as directed. Dispense based on patient and insurance preference. Use up to four times daily as directed. (FOR ICD-10 E10.9, E11.9).   prenatal multivitamin Tabs tablet Take 1 tablet by mouth daily at 12 noon.   propranolol  10 MG tablet Commonly known as: INDERAL  TAKE 1 TABLET BY MOUTH THREE TIMES A DAY           Signed: Taisa Deloria M Mellanie Hood, CNM  07/28/2024, 1:48 PM     [1]  No current facility-administered medications on file prior to encounter.   Current Outpatient Medications on File Prior to Encounter  Medication Sig Dispense Refill   aspirin   81 MG chewable tablet Chew 1 tablet (81 mg total) by mouth daily. (Patient not taking: Reported on 07/20/2024)     Blood Glucose Monitoring Suppl DEVI 1 each by Does not apply route in the morning, at noon, and at bedtime. May substitute to any manufacturer covered by patient's insurance. 1 each 0   Glucose Blood (BLOOD GLUCOSE  TEST STRIPS) STRP 1 each by In Vitro route in the morning, at noon, and at bedtime. May substitute to any manufacturer covered by patient's insurance. 100 strip 3   Lancet Device MISC 1 each by Does not apply route in the morning, at noon, and at bedtime. May substitute to any manufacturer covered by patient's insurance. 1 each 3   Lancets MISC 1 each by Does not apply route as directed. Dispense based on patient and insurance preference. Use up to four times daily as directed. (FOR ICD-10 E10.9, E11.9). 100 each 0   Prenatal Vit-Fe Fumarate-FA (PRENATAL MULTIVITAMIN) TABS tablet Take 1 tablet by mouth daily at 12 noon.     propranolol  (INDERAL ) 10 MG tablet TAKE 1 TABLET BY MOUTH THREE TIMES A DAY (Patient not taking: Reported on 07/20/2024) 30 tablet 1  [2] No Known Allergies

## 2024-08-01 NOTE — Patient Instructions (Addendum)
 Tdap (Tetanus, Diphtheria, Pertussis) Vaccine: What You Need to Know (CDC VIS)  Many Vaccine Information Statements are available in Spanish and other languages. See http://www.mcdowell.com/ To view this CDC Vaccine Information Statement in English, click the link below or scan the QR code: https://pe.elsevier.com/6g2RRsUk  This information is not intended to replace advice given to you by your health care provider. Make sure you discuss any questions you have with your health care provider. Vaccine Information Statements (VISs) are third - party content from [facility] & Immunize.org and Elsevier is not responsible for the content.     Immunize.org Market researcher.     Elsevier Patient Education  [employer].   Third Trimester of Pregnancy  The third trimester of pregnancy is from week 28 through week 40. This is months 7 through 9. The third trimester is a time when your baby is growing fast. Body changes during your third trimester Your body continues to change during this time. The changes usually go away after your baby is born. Physical changes You will continue to gain weight. You may get stretch marks on your hips, belly, and breasts. Your breasts will keep growing and may hurt. A yellow fluid (colostrum) may leak from your breasts. This is the first milk you're making for your baby. Your hair may grow faster and get thicker. In some cases, you may get hair loss. Your belly button may stick out. You may have more swelling in your hands, face, or ankles. Health changes You may have heartburn. You may feel short of breath. This is caused by the uterus that is now bigger. You may have more aches in the pelvis, back, or thighs. You may have more tingling or numbness in your hands, arms, and legs. You may pee more often. You may have trouble pooping (constipation) or swollen veins in the butt that can itch or get painful (hemorrhoids). Other changes You may have  more problems sleeping. You may notice the baby moving lower in your belly (dropping). You may have more fluid coming from your vagina. Your joints may feel loose, and you may have pain around your pelvic bone. Follow these instructions at home: Medicines Take medicines only as told by your health care provider. Some medicines are not safe during pregnancy. Your provider may change the medicines that you take. Do not take any medicines unless told to by your provider. Take a prenatal vitamin that has at least 600 micrograms (mcg) of folic acid. Do not use herbal medicines, illegal drugs, or medicines that are not approved by your provider. Eating and drinking While you're pregnant your body needs additional nutrition to help support your growing baby. Talk with your provider about your nutritional needs. Activity Most women are able to exercise regularly during pregnancy. Exercise routines may need to change at the end of your pregnancy. Talk to your provider about your activities and exercise routine. Relieving pain and discomfort Rest often with your legs raised if you have leg cramps or low back pain. Take warm sitz baths to soothe pain from hemorrhoids. Use hemorrhoid cream if your provider says it's okay. Wear a good, supportive bra if your breasts hurt. Do not use hot tubs, steam rooms, or saunas. Do not douche. Do not use tampons or scented pads. Safety Talk to your provider before traveling far distances. Wear your seatbelt at all times when you're in a car. Talk to your provider if someone hits you, hurts you, or yells at you. Preparing for birth To  prepare for your baby: Take childbirth and breastfeeding classes. Visit the hospital and tour the maternity area. Buy a rear-facing car seat. Learn how to install it in your car. General instructions Avoid cat litter boxes and soil used by cats. These things carry germs that can cause harm to your pregnancy and your baby. Do not  drink alcohol, smoke, vape, or use products with nicotine  or tobacco in them. If you need help quitting, talk with your provider. Keep all follow-up visits for your third trimester. Your provider will do more exams and tests during this trimester. Write down your questions. Take them to your prenatal visits. Your provider also will: Talk with you about your overall health. Give you advice or refer you to specialists who can help with different needs, including: Mental health and counseling. Foods and healthy eating. Ask for help if you need help with food. Where to find more information American Pregnancy Association: americanpregnancy.org [employer] of Obstetricians and Gynecologists: acog.org Office on [employer] Health: travell[place]on.ca Contact a health care provider if: You have a headache that does not go away when you take medicine. You have any of these problems: You can't eat or drink. You have nausea and vomiting. You have watery poop (diarrhea) for 2 days or more. You have pain when you pee, or your pee smells bad. You have been sick for 2 days or more and aren't getting better. Contact your provider right away if: You have any of these coming from your vagina: Abnormal discharge. Bad-smelling fluid. Bleeding. Your baby is moving less than usual. You have signs of labor: You have any contractions, belly cramping, or have pain in your pelvis or lower back before 37 weeks of pregnancy (preterm labor). You have regular contractions that are less than 5 minutes apart. Your water breaks. You have symptoms of high blood pressure or preeclampsia. These include: A severe, throbbing headache that does not go away. Sudden or extreme swelling of your face, hands, legs, or feet. Vision problems: You see spots. You have blurry vision. Your eyes are sensitive to light. If you can't reach your provider, go to an urgent care or emergency room. Get help right away if: You faint,  become confused, or can't think clearly. You have chest pain or trouble breathing. You have any kind of injury, such as from a fall or a car crash. These symptoms may be an emergency. Call 911 right away. Do not wait to see if the symptoms will go away. Do not drive yourself to the hospital. This information is not intended to replace advice given to you by your health care provider. Make sure you discuss any questions you have with your health care provider. Document Revised: 04/07/2023 Document Reviewed: 11/05/2022 Elsevier Patient Education  2024 [employer].

## 2024-08-01 NOTE — Progress Notes (Deleted)
" ° ° °  Return Prenatal Note   Subjective   34 y.o. H6E7997 at [redacted]w[redacted]d presents for this follow-up prenatal visit.  Patient is doing well. She reports good fetal movement. She denies urinary symptoms and denies new concerns today.  Patient reports: Movement: Present Contractions: Irritability  Objective   Flow sheet Vitals: Pulse Rate: 83 BP: 114/60 Total weight gain: 26 lb 6.4 oz (12 kg)  General Appearance  No acute distress, well appearing, and well nourished Pulmonary   Normal work of breathing Neurologic   Alert and oriented to person, place, and time Psychiatric   Mood and affect within normal limits   Assessment/Plan   Plan  34 y.o. [id number] at [redacted]w[redacted]d presents for follow-up OB visit. Reviewed prenatal record including previous visit note.  No problem-specific Assessment & Plan notes found for this encounter.      Orders Placed This Encounter  Procedures   POC Urinalysis Dipstick OB   No follow-ups on file.   Future Appointments  Date Time Provider Department Center  08/10/2024  8:30 AM Katheleen Aleene ORN, RD [facility] None    For next visit:  continue with routine prenatal care     Damien Parsley, CNM [facility] 01/16/268:28 AM "

## 2024-08-02 ENCOUNTER — Ambulatory Visit (INDEPENDENT_AMBULATORY_CARE_PROVIDER_SITE_OTHER)

## 2024-08-02 DIAGNOSIS — Z3A32 32 weeks gestation of pregnancy: Secondary | ICD-10-CM

## 2024-08-02 DIAGNOSIS — O24410 Gestational diabetes mellitus in pregnancy, diet controlled: Secondary | ICD-10-CM

## 2024-08-03 ENCOUNTER — Ambulatory Visit: Admitting: Certified Nurse Midwife

## 2024-08-03 VITALS — BP 114/60 | HR 83 | Wt 182.4 lb

## 2024-08-03 DIAGNOSIS — Z23 Encounter for immunization: Secondary | ICD-10-CM

## 2024-08-03 DIAGNOSIS — O368131 Decreased fetal movements, third trimester, fetus 1: Secondary | ICD-10-CM

## 2024-08-03 DIAGNOSIS — O368130 Decreased fetal movements, third trimester, not applicable or unspecified: Secondary | ICD-10-CM

## 2024-08-03 DIAGNOSIS — Z3480 Encounter for supervision of other normal pregnancy, unspecified trimester: Secondary | ICD-10-CM

## 2024-08-03 DIAGNOSIS — Z3A32 32 weeks gestation of pregnancy: Secondary | ICD-10-CM

## 2024-08-03 DIAGNOSIS — O24410 Gestational diabetes mellitus in pregnancy, diet controlled: Secondary | ICD-10-CM

## 2024-08-03 LAB — POCT URINALYSIS DIPSTICK OB
Bilirubin, UA: NEGATIVE
Blood, UA: NEGATIVE
Glucose, UA: NEGATIVE
Ketones, UA: NEGATIVE
Nitrite, UA: NEGATIVE
Spec Grav, UA: 1.02
Urobilinogen, UA: 0.2 U/dL
pH, UA: 6.5

## 2024-08-03 NOTE — Assessment & Plan Note (Addendum)
 Patient went to triage for decreased fetal movement and was reassured baby was looking well. Believes it might have been due to low sugar levels with intentional changes in diet after GDM diagnosis.   Reviewed kick counts, patient aware of normal fetal patterns and reports baby has returned to normal activity since.

## 2024-08-03 NOTE — Progress Notes (Signed)
" ° ° °  Return Prenatal Note   Subjective   34 y.o. H6E7997 at [redacted]w[redacted]d presents for this follow-up prenatal visit.  Patient is doing well. She reports good fetal movement. She denies urinary symptoms and denies new concerns today.  Patient reports: Tracking blood glucose levels and noticing a more difficult time managing fasting glucose levels.  Movement: Present Contractions: Irritability  Objective   Flow sheet Vitals: Pulse Rate: 83 BP: 114/60 Fundal Height: 33 cm Fetal Heart Rate (bpm): 145 Total weight gain: 12 kg  General Appearance  No acute distress, well appearing, and well nourished Pulmonary   Normal work of breathing Neurologic   Alert and oriented to person, place, and time Psychiatric   Mood and affect within normal limits   Assessment/Plan   Plan  34 y.o. [id number] at [redacted]w[redacted]d presents for follow-up OB visit. Reviewed prenatal record including previous visit note.  Gestational diabetes Fasting glucose: 97,97, 04,21,884 Postprandial: 99, 101,92,110,102, 120, 136 *elevated levels were result of chinese take out that day, patient aware of meal correlation.   Discussed having protein snack before bedtime to help with fasting glucose levels. Talked about walking after meals during the day.  Patient has education at [facility] regional nutrition center scheduled for this Friday.   Decreased fetal movement Patient went to triage for decreased fetal movement and was reassured baby was looking well. Believes it might have been due to low sugar levels with intentional changes in diet after GDM diagnosis.   Reviewed kick counts, patient aware of normal fetal patterns and reports baby has returned to normal activity since.       Orders Placed This Encounter  Procedures   Tdap vaccine greater than or equal to 7yo IM   POC Urinalysis Dipstick OB   No follow-ups on file.   Future Appointments  Date Time Provider Department Center  08/10/2024  8:30 AM Katheleen Aleene ORN, RD  [facility] None    For next visit:  continue with routine prenatal care     Damien Parsley, CNM [facility] 01/16/269:03 AM "

## 2024-08-03 NOTE — Assessment & Plan Note (Addendum)
 Fasting glucose: 97,97, 04,21,884 Postprandial: 99, 101,92,110,102, 120, 136 *elevated levels were result of chinese take out that day, patient aware of meal correlation.   Discussed having protein snack before bedtime to help with fasting glucose levels. Talked about walking after meals during the day.  Patient has education at [facility] regional nutrition center scheduled for this Friday.   Growth US  yesterday. EFW 46%, AC 52%

## 2024-08-07 ENCOUNTER — Ambulatory Visit: Payer: Self-pay | Admitting: Obstetrics

## 2024-08-10 ENCOUNTER — Encounter: Admitting: Dietician

## 2024-08-16 NOTE — Patient Instructions (Addendum)
 Respiratory Syncytial Virus (RSV) Vaccine Injection What is this medication? RESPIRATORY SYNCYTIAL VIRUS VACCINE (reh SPIR uh tor ee sin SISH uhl VY rus vak SEEN) reduces the risk of respiratory syncytial virus (RSV). It does not treat RSV. It is still possible to get RSV after receiving this vaccine, but the symptoms may be less severe or not last as long. It works by helping your immune system learn how to fight off a future infection. This medicine may be used for other purposes; ask your health care provider or pharmacist if you have questions. COMMON BRAND NAME(S): ABRYSVO, AREXVY, mRESVIA What should I tell my care team before I take this medication? They need to know if you have any of these conditions: Immune system problems An unusual or allergic reaction to respiratory syncytial virus vaccine, other medications, foods, dyes, or preservatives Pregnant or trying to get pregnant Breastfeeding How should I use this medication? This vaccine is injected into a muscle. It is given by your care team. A copy of Vaccine Information Statements will be given before each vaccination. Be sure to read this information carefully each time. This sheet may change often. A copy of Vaccine Information Statements will be given before each vaccination. Be sure to read this information carefully each time. This sheet may change often. Talk to your care team about the use of this vaccine in children. It is not approved for use in children. Overdosage: If you think you have taken too much of this medicine contact a poison control center or emergency room at once. NOTE: This medicine is only for you. Do not share this medicine with others. What if I miss a dose? This does not apply. What may interact with this medication? Medications that lower your chance of fighting infection This list may not describe all possible interactions. Give your health care provider a list of all the medicines, herbs,  non-prescription drugs, or dietary supplements you use. Also tell them if you smoke, drink alcohol, or use illegal drugs. Some items may interact with your medicine. What should I watch for while using this medication? Visit your care team for regular health checks. Before you receive this vaccine, talk to your care team if you have an acute illness. Vaccines can be given to people with mild acute illness, such as the common cold or diarrhea. Discuss with your care team the risks and benefits of receiving this vaccine during a moderate to severe illness. Your care team may choose to wait to give you the vaccine when you feel better. Report any side effects to your care team or to the Vaccine Adverse Event Reporting System (VAERS) website at https://vaers.lagents.no. This is only for reporting side effects; VAERs staff do not give medical advice. What side effects may I notice from receiving this medication? Side effects that you should report to your care team as soon as possible: Allergic reactions--skin rash, itching, hives, swelling of the face, lips, tongue, or throat Feeling faint or lightheaded Weakness and tingling in the feet and legs that spreads to upper body Side effects that usually do not require medical attention (report these to your care team if they continue or are bothersome): General discomfort and fatigue Headache Muscle pain Pain, redness, or irritation at injection site This list may not describe all possible side effects. Call your doctor for medical advice about side effects. You may report side effects to FDA at 1-800-FDA-1088. Where should I keep my medication? This vaccine is only given by your care  team. It will not be stored at home. NOTE: This sheet is a summary. It may not cover all possible information. If you have questions about this medicine, talk to your doctor, pharmacist, or health care provider.  2025 Elsevier/Gold Standard (2023-07-27 00:00:00) How to Count  Your Unborn Baby's Movements During Pregnancy You'll start feeling your baby move in the middle of your pregnancy. At first, these movements might feel like flutters, rolls, or swishes. As your baby grows, you might feel more kicks and jabs. Around week 28 of your pregnancy, your health care team may ask you to count how often your baby moves. This is important for all pregnancies, but especially for high-risk ones. Counting movements can help lessen the risk of stillbirth. What is a fetal movement count? A fetal movement count is the number of times that you feel your baby move during a certain amount of time. This may also be called a kick count. There are many ways to do a kick count. Ask your team what is best for you. As your baby grows and moves more, you may notice: When your baby is most active. Your baby's sleep and wake cycles. Things that make your baby move more. When you do a movement count, try to do it: When your baby is normally most active. At the same time each day. How do I count fetal movements?  Find a quiet, comfortable area. Sit or lie down. Write down the date, the start time, and the number of movements you feel. Count kicks, flutters, swishes, rolls, and jabs. Usually, you'll feel at least 10 movements within 2 hours. Stop counting after you have felt 10 movements or if you have been counting for 2 hours. Write down the stop time. Contact a health care provider if: You don't feel 10 movements in 2 hours. Your baby isn't moving as it usually does. Your baby isn't moving at all. If you're not able to reach your provider, go to an emergency room. This information is not intended to replace advice given to you by your health care provider. Make sure you discuss any questions you have with your health care provider. Document Revised: 04/12/2024 Document Reviewed: 04/12/2024 Elsevier Patient Education  2025 [employer].

## 2024-08-17 ENCOUNTER — Ambulatory Visit: Admitting: Advanced Practice Midwife

## 2024-08-17 ENCOUNTER — Encounter: Payer: Self-pay | Admitting: Advanced Practice Midwife

## 2024-08-17 VITALS — BP 105/63 | HR 97 | Wt 183.1 lb

## 2024-08-17 DIAGNOSIS — Z3A33 33 weeks gestation of pregnancy: Secondary | ICD-10-CM

## 2024-08-17 DIAGNOSIS — O24410 Gestational diabetes mellitus in pregnancy, diet controlled: Secondary | ICD-10-CM

## 2024-08-17 DIAGNOSIS — Z9229 Personal history of other drug therapy: Secondary | ICD-10-CM

## 2024-08-17 DIAGNOSIS — Z3480 Encounter for supervision of other normal pregnancy, unspecified trimester: Secondary | ICD-10-CM

## 2024-08-17 DIAGNOSIS — Z3A34 34 weeks gestation of pregnancy: Secondary | ICD-10-CM

## 2024-08-17 MED ORDER — BLOOD GLUCOSE TEST VI STRP: 1.0000 | ORAL_STRIP | Freq: Three times a day (TID) | 3 refills | Status: AC

## 2024-08-17 NOTE — Progress Notes (Signed)
 "   Routine Prenatal Care Visit  Subjective  Tracey Hood is a 34 y.o. G3P2002 at [redacted]w[redacted]d being seen today for ongoing prenatal care.  She is currently monitored for the following issues for this low-risk pregnancy and has Supervision of other normal pregnancy, antepartum; Elevated glucose tolerance test; Gestational diabetes; and Decreased fetal movement on their problem list.  ----------------------------------------------------------------------------------- Patient reports she is doing well. Admits occasionally eating out or eating larger portion on nights prior to elevated fasting BG. Trying to increase physical activity. She is paying attention to fetal movements. Was recently seen in triage for decreased movement.  Contractions: Not present. Vag. Bleeding: None.  Movement: Present. Leaking Fluid denies.  ----------------------------------------------------------------------------------- The following portions of the patient's history were reviewed and updated as appropriate: allergies, current medications, past family history, past medical history, past social history, past surgical history and problem list. Problem list updated.  Objective  BP 105/63   Pulse 97   Wt 183 lb 1.6 oz (83.1 kg)   LMP 12/29/2023   BMI 32.43 kg/m  Pregravid weight 156 lb (70.8 kg) Total Weight Gain 27 lb 1.6 oz (12.3 kg) Urinalysis: Urine Protein    Urine Glucose    Fetal Status: Fetal Heart Rate (bpm): 145 Fundal Height: 35 cm Movement: Present      General:  Alert, oriented and cooperative. Patient is in no acute distress.  Skin: Skin is warm and dry. No rash noted.   Cardiovascular: Normal heart rate noted  Respiratory: Normal respiratory effort, no problems with respiration noted  Abdomen: Soft, gravid, appropriate for gestational age. Pain/Pressure: Absent (a little bit of pressure)     Pelvic:  Cervical exam deferred        Extremities: Normal range of motion.  Edema: None  Mental Status:  Normal mood and affect. Normal behavior. Normal judgment and thought content.   Date Fasting Breakfast Lunch Dinner  08/12/24 103  70 78  08/13/24 86  126 94  08/14/24 90  86 121  08/15/24 111  90 94  08/16/24 95  103 70                Highest /lowest fasting: 111/86 2 hour post prandial Highest/lowest Breakfast:  Highest lowest Lunch: 126/70 Highest/lowest Dinner: 121/70 Assessment   33 y.o. G3P2002 at [redacted]w[redacted]d by  09/28/2024, by Ultrasound presenting for routine prenatal visit  Plan   Pregnancy Problems (from 03/26/24 to present)     Problem Noted Diagnosed Resolved   Supervision of other normal pregnancy, antepartum 03/26/2024 by Tamea Annalee DEL, CMA  No   Overview Addendum 08/16/2024  4:57 PM by Taft Salines, LPN   Clinical Staff Provider  Office Location  [facility] Dating  US   Language  English Anatomy US   Norm; Anterior placenta  Flu Vaccine  OFFER Genetic Screen  NIPS: Not done  TDaP vaccine  08/03/24 Hgb A1C or  GTT Early : Not Done Third trimester : 178; [phone]  Covid Has had   LAB RESULTS   Rhogam  B/Positive/-- (09/17 1037) Blood Type B/Positive/-- (09/17 1037)   RSV OFFER Antibody Negative (09/17 1037)  Feeding Plan BREAST Rubella 3.95 (09/17 1037)  Contraception IUD RPR Non Reactive (09/17 1037)   Circumcision YES IF BOY HBsAg Negative (09/17 1037)   Pediatrician  [facility] HIV Non Reactive (09/17 1037)  Support Person Mother Varicella Reactive (09/17 1037)  Prenatal Classes YES GBS  (For PCN allergy, check sensitivities)     Hep C Non Reactive (09/17  1037)   BTL Consent NA Pap No results found for: DIAGPAP  VBAC Consent NA Hgb Electro  Normal    CF      SMA                    Preterm labor symptoms and general obstetric precautions including but not limited to vaginal bleeding, contractions, leaking of fluid and fetal movement were reviewed in detail with the patient. Please refer to After Visit Summary for other counseling  recommendations.   Return in about 2 weeks (around 08/31/2024) for rob.   Slater Rains, CNM [facility] 08/17/2024 11:29 AM    "

## 2024-08-31 ENCOUNTER — Encounter: Admitting: Licensed Practical Nurse
